# Patient Record
Sex: Male | Born: 1955 | Race: White | Hispanic: No | Smoking: Former smoker
Health system: Southern US, Community
[De-identification: ages and names within clinical notes are randomized; demographics above are authoritative.]

## PROBLEM LIST (undated history)

## (undated) DIAGNOSIS — J449 Chronic obstructive pulmonary disease, unspecified: Secondary | ICD-10-CM

## (undated) DIAGNOSIS — R9430 Abnormal result of cardiovascular function study, unspecified: Secondary | ICD-10-CM

## (undated) DIAGNOSIS — Z860100 Personal history of colon polyps, unspecified: Secondary | ICD-10-CM

## (undated) DIAGNOSIS — I779 Disorder of arteries and arterioles, unspecified: Secondary | ICD-10-CM

## (undated) DIAGNOSIS — T7840XA Allergy, unspecified, initial encounter: Secondary | ICD-10-CM

## (undated) DIAGNOSIS — I2510 Atherosclerotic heart disease of native coronary artery without angina pectoris: Secondary | ICD-10-CM

## (undated) DIAGNOSIS — R001 Bradycardia, unspecified: Secondary | ICD-10-CM

## (undated) DIAGNOSIS — K219 Gastro-esophageal reflux disease without esophagitis: Secondary | ICD-10-CM

## (undated) DIAGNOSIS — K225 Diverticulum of esophagus, acquired: Secondary | ICD-10-CM

## (undated) DIAGNOSIS — J45909 Unspecified asthma, uncomplicated: Secondary | ICD-10-CM

## (undated) DIAGNOSIS — I739 Peripheral vascular disease, unspecified: Secondary | ICD-10-CM

## (undated) DIAGNOSIS — I10 Essential (primary) hypertension: Secondary | ICD-10-CM

## (undated) DIAGNOSIS — K449 Diaphragmatic hernia without obstruction or gangrene: Secondary | ICD-10-CM

## (undated) DIAGNOSIS — E785 Hyperlipidemia, unspecified: Secondary | ICD-10-CM

## (undated) HISTORY — DX: Bradycardia, unspecified: R00.1

## (undated) HISTORY — DX: Gastro-esophageal reflux disease without esophagitis: K21.9

## (undated) HISTORY — DX: Personal history of colon polyps, unspecified: Z86.0100

## (undated) HISTORY — DX: Nonrheumatic mitral (valve) insufficiency: I34.0

## (undated) HISTORY — DX: Diverticulum of esophagus, acquired: K22.5

## (undated) HISTORY — DX: Chronic obstructive pulmonary disease, unspecified: J44.9

## (undated) HISTORY — PX: COLONOSCOPY: SHX174

## (undated) HISTORY — DX: Hyperlipidemia, unspecified: E78.5

## (undated) HISTORY — DX: Abnormal result of cardiovascular function study, unspecified: R94.30

## (undated) HISTORY — DX: Peripheral vascular disease, unspecified: I73.9

## (undated) HISTORY — DX: Personal history of colonic polyps: Z86.010

## (undated) HISTORY — DX: Essential (primary) hypertension: I10

## (undated) HISTORY — DX: Atherosclerotic heart disease of native coronary artery without angina pectoris: I25.10

## (undated) HISTORY — DX: Allergy, unspecified, initial encounter: T78.40XA

## (undated) HISTORY — DX: Diaphragmatic hernia without obstruction or gangrene: K44.9

## (undated) HISTORY — DX: Disorder of arteries and arterioles, unspecified: I77.9

## (undated) HISTORY — DX: Unspecified asthma, uncomplicated: J45.909

---

## 2002-02-24 HISTORY — PX: CORONARY ANGIOPLASTY WITH STENT PLACEMENT: SHX49

## 2002-03-11 ENCOUNTER — Inpatient Hospital Stay (HOSPITAL_COMMUNITY): Admission: EM | Admit: 2002-03-11 | Discharge: 2002-03-17 | Payer: Self-pay | Admitting: Emergency Medicine

## 2002-03-11 ENCOUNTER — Encounter: Payer: Self-pay | Admitting: *Deleted

## 2002-03-11 ENCOUNTER — Encounter: Payer: Self-pay | Admitting: Cardiovascular Disease

## 2002-04-07 ENCOUNTER — Encounter (HOSPITAL_COMMUNITY): Admission: RE | Admit: 2002-04-07 | Discharge: 2002-07-06 | Payer: Self-pay | Admitting: Cardiology

## 2004-09-27 ENCOUNTER — Ambulatory Visit: Payer: Self-pay | Admitting: Cardiology

## 2005-04-11 ENCOUNTER — Ambulatory Visit: Payer: Self-pay | Admitting: Cardiology

## 2005-04-16 ENCOUNTER — Ambulatory Visit: Payer: Self-pay | Admitting: Cardiology

## 2005-04-27 ENCOUNTER — Ambulatory Visit: Payer: Self-pay | Admitting: Internal Medicine

## 2005-05-28 ENCOUNTER — Ambulatory Visit: Payer: Self-pay | Admitting: Internal Medicine

## 2005-05-28 ENCOUNTER — Encounter (INDEPENDENT_AMBULATORY_CARE_PROVIDER_SITE_OTHER): Payer: Self-pay | Admitting: *Deleted

## 2005-05-28 LAB — HM COLONOSCOPY

## 2005-05-30 ENCOUNTER — Ambulatory Visit (HOSPITAL_COMMUNITY): Admission: RE | Admit: 2005-05-30 | Discharge: 2005-05-30 | Payer: Self-pay | Admitting: Internal Medicine

## 2005-08-21 ENCOUNTER — Ambulatory Visit (HOSPITAL_COMMUNITY): Admission: RE | Admit: 2005-08-21 | Discharge: 2005-08-21 | Payer: Self-pay | Admitting: Otolaryngology

## 2005-08-27 HISTORY — PX: ZENKER'S DIVERTICULECTOMY ENDOSCOPIC: SHX5224

## 2005-09-07 ENCOUNTER — Observation Stay (HOSPITAL_COMMUNITY): Admission: EM | Admit: 2005-09-07 | Discharge: 2005-09-07 | Payer: Self-pay | Admitting: Emergency Medicine

## 2005-09-19 ENCOUNTER — Ambulatory Visit (HOSPITAL_COMMUNITY): Admission: RE | Admit: 2005-09-19 | Discharge: 2005-09-20 | Payer: Self-pay | Admitting: Otolaryngology

## 2006-04-16 ENCOUNTER — Ambulatory Visit: Payer: Self-pay | Admitting: Cardiology

## 2006-06-14 ENCOUNTER — Ambulatory Visit: Payer: Self-pay | Admitting: Internal Medicine

## 2006-12-11 ENCOUNTER — Ambulatory Visit: Payer: Self-pay | Admitting: Internal Medicine

## 2006-12-11 ENCOUNTER — Encounter: Payer: Self-pay | Admitting: Internal Medicine

## 2006-12-11 LAB — CONVERTED CEMR LAB
ALT: 22 units/L (ref 0–40)
Albumin: 4 g/dL (ref 3.5–5.2)
BUN: 16 mg/dL (ref 6–23)
CO2: 28 meq/L (ref 19–32)
Calcium: 9.5 mg/dL (ref 8.4–10.5)
Chloride: 107 meq/L (ref 96–112)
Cholesterol: 127 mg/dL (ref 0–200)
Creatinine, Ser: 0.9 mg/dL (ref 0.4–1.5)
GFR calc Af Amer: 114 mL/min
GFR calc non Af Amer: 94 mL/min
Glucose, Bld: 101 mg/dL — ABNORMAL HIGH (ref 70–99)
HDL: 43.9 mg/dL (ref >39.0)
LDL Cholesterol: 60 mg/dL (ref 0–99)
Phosphorus: 3.3 mg/dL (ref 2.3–4.6)
Potassium: 4.4 meq/L (ref 3.5–5.1)
Sodium: 141 meq/L (ref 135–145)
Total CHOL/HDL Ratio: 2.9
Triglycerides: 114 mg/dL (ref 0–149)
VLDL: 23 mg/dL (ref 0–40)

## 2007-04-03 ENCOUNTER — Ambulatory Visit: Payer: Self-pay | Admitting: Cardiology

## 2007-04-18 ENCOUNTER — Ambulatory Visit: Payer: Self-pay

## 2007-05-05 ENCOUNTER — Ambulatory Visit: Payer: Self-pay | Admitting: Cardiology

## 2007-05-16 ENCOUNTER — Encounter: Payer: Self-pay | Admitting: Cardiology

## 2007-05-16 ENCOUNTER — Ambulatory Visit: Payer: Self-pay

## 2007-06-13 ENCOUNTER — Ambulatory Visit: Payer: Self-pay | Admitting: Internal Medicine

## 2007-06-16 LAB — CONVERTED CEMR LAB
ALT: 32 units/L (ref 0–53)
Albumin: 4.2 g/dL (ref 3.5–5.2)
BUN: 15 mg/dL (ref 6–23)
CO2: 30 meq/L (ref 19–32)
Calcium: 9.6 mg/dL (ref 8.4–10.5)
Chloride: 108 meq/L (ref 96–112)
Cholesterol: 141 mg/dL (ref 0–200)
Creatinine, Ser: 0.9 mg/dL (ref 0.4–1.5)
GFR calc Af Amer: 114 mL/min
GFR calc non Af Amer: 94 mL/min
Glucose, Bld: 101 mg/dL — ABNORMAL HIGH (ref 70–99)
HDL: 41.8 mg/dL (ref >39.0)
LDL Cholesterol: 72 mg/dL (ref 0–99)
Phosphorus: 3.4 mg/dL (ref 2.3–4.6)
Potassium: 4.6 meq/L (ref 3.5–5.1)
Sodium: 143 meq/L (ref 135–145)
Total CHOL/HDL Ratio: 3.4
Triglycerides: 136 mg/dL (ref 0–149)
VLDL: 27 mg/dL (ref 0–40)

## 2007-06-24 ENCOUNTER — Ambulatory Visit: Payer: Self-pay | Admitting: Cardiology

## 2007-07-14 ENCOUNTER — Encounter: Payer: Self-pay | Admitting: Cardiology

## 2007-07-14 ENCOUNTER — Ambulatory Visit: Payer: Self-pay

## 2007-07-17 ENCOUNTER — Ambulatory Visit: Payer: Self-pay | Admitting: Cardiology

## 2007-11-10 ENCOUNTER — Ambulatory Visit: Payer: Self-pay | Admitting: Internal Medicine

## 2007-11-28 ENCOUNTER — Ambulatory Visit: Payer: Self-pay | Admitting: Internal Medicine

## 2007-11-28 DIAGNOSIS — J309 Allergic rhinitis, unspecified: Secondary | ICD-10-CM | POA: Insufficient documentation

## 2007-12-01 LAB — CONVERTED CEMR LAB
ALT: 29 units/L (ref 0–53)
AST: 27 units/L (ref 0–37)
Albumin: 4.2 g/dL (ref 3.5–5.2)
Alkaline Phosphatase: 70 units/L (ref 39–117)
BUN: 16 mg/dL (ref 6–23)
Basophils Absolute: 0 10*3/uL (ref 0.0–0.1)
Basophils Relative: 0.5 % (ref 0.0–1.0)
Bilirubin, Direct: 0.2 mg/dL (ref 0.0–0.3)
CO2: 29 meq/L (ref 19–32)
Calcium: 9.9 mg/dL (ref 8.4–10.5)
Chloride: 107 meq/L (ref 96–112)
Cholesterol: 127 mg/dL (ref 0–200)
Creatinine, Ser: 1 mg/dL (ref 0.4–1.5)
Eosinophils Absolute: 0.1 10*3/uL (ref 0.0–0.7)
Eosinophils Relative: 1.1 % (ref 0.0–5.0)
GFR calc Af Amer: 101 mL/min
GFR calc non Af Amer: 83 mL/min
Glucose, Bld: 107 mg/dL — ABNORMAL HIGH (ref 70–99)
HCT: 46.9 % (ref 39.0–52.0)
HDL: 40.6 mg/dL (ref >39.0)
Hemoglobin: 15.2 g/dL (ref 13.0–17.0)
LDL Cholesterol: 69 mg/dL (ref 0–99)
Lymphocytes Relative: 35.7 % (ref 12.0–46.0)
MCHC: 32.4 g/dL (ref 30.0–36.0)
MCV: 91.5 fL (ref 78.0–100.0)
Monocytes Absolute: 0.5 10*3/uL (ref 0.1–1.0)
Monocytes Relative: 8.2 % (ref 3.0–12.0)
Neutro Abs: 3.3 10*3/uL (ref 1.4–7.7)
Neutrophils Relative %: 54.5 % (ref 43.0–77.0)
PSA: 2.3 ng/mL (ref 0.10–4.00)
Phosphorus: 4.5 mg/dL (ref 2.3–4.6)
Platelets: 237 10*3/uL (ref 150–400)
Potassium: 4.7 meq/L (ref 3.5–5.1)
RBC: 5.13 M/uL (ref 4.22–5.81)
RDW: 12.1 % (ref 11.5–14.6)
Sodium: 142 meq/L (ref 135–145)
TSH: 1.74 microintl units/mL (ref 0.35–5.50)
Total Bilirubin: 0.9 mg/dL (ref 0.3–1.2)
Total CHOL/HDL Ratio: 3.1
Total Protein: 7 g/dL (ref 6.0–8.3)
Triglycerides: 89 mg/dL (ref 0–149)
VLDL: 18 mg/dL (ref 0–40)
WBC: 6.1 10*3/uL (ref 4.5–10.5)

## 2007-12-29 ENCOUNTER — Ambulatory Visit: Payer: Self-pay | Admitting: Cardiology

## 2008-06-04 ENCOUNTER — Ambulatory Visit: Payer: Self-pay | Admitting: Internal Medicine

## 2008-06-07 LAB — CONVERTED CEMR LAB
ALT: 42 units/L (ref 0–53)
AST: 37 units/L (ref 0–37)
Albumin: 4.4 g/dL (ref 3.5–5.2)
Alkaline Phosphatase: 75 units/L (ref 39–117)
BUN: 17 mg/dL (ref 6–23)
Bilirubin, Direct: 0.2 mg/dL (ref 0.0–0.3)
CO2: 26 meq/L (ref 19–32)
Calcium: 9.8 mg/dL (ref 8.4–10.5)
Chloride: 105 meq/L (ref 96–112)
Cholesterol: 148 mg/dL (ref 0–200)
Creatinine, Ser: 1 mg/dL (ref 0.4–1.5)
GFR calc Af Amer: 101 mL/min
GFR calc non Af Amer: 83 mL/min
Glucose, Bld: 90 mg/dL (ref 70–99)
HDL: 43.5 mg/dL (ref >39.0)
LDL Cholesterol: 87 mg/dL (ref 0–99)
Phosphorus: 3.8 mg/dL (ref 2.3–4.6)
Potassium: 4.1 meq/L (ref 3.5–5.1)
Sodium: 139 meq/L (ref 135–145)
Total Bilirubin: 1.4 mg/dL — ABNORMAL HIGH (ref 0.3–1.2)
Total CHOL/HDL Ratio: 3.4
Total Protein: 7 g/dL (ref 6.0–8.3)
Triglycerides: 88 mg/dL (ref 0–149)
VLDL: 18 mg/dL (ref 0–40)

## 2008-07-06 ENCOUNTER — Ambulatory Visit: Payer: Self-pay | Admitting: Cardiology

## 2008-12-14 ENCOUNTER — Ambulatory Visit: Payer: Self-pay | Admitting: Internal Medicine

## 2008-12-16 LAB — CONVERTED CEMR LAB
ALT: 42 units/L (ref 0–53)
AST: 38 units/L — ABNORMAL HIGH (ref 0–37)
Albumin: 4.3 g/dL (ref 3.5–5.2)
Alkaline Phosphatase: 72 units/L (ref 39–117)
BUN: 10 mg/dL (ref 6–23)
Basophils Absolute: 0 10*3/uL (ref 0.0–0.1)
Basophils Relative: 0.7 % (ref 0.0–3.0)
Bilirubin, Direct: 0.1 mg/dL (ref 0.0–0.3)
CO2: 30 meq/L (ref 19–32)
Calcium: 9.7 mg/dL (ref 8.4–10.5)
Chloride: 107 meq/L (ref 96–112)
Cholesterol: 166 mg/dL (ref 0–200)
Creatinine, Ser: 0.9 mg/dL (ref 0.4–1.5)
Eosinophils Absolute: 0.1 10*3/uL (ref 0.0–0.7)
Eosinophils Relative: 1.6 % (ref 0.0–5.0)
Glucose, Bld: 104 mg/dL — ABNORMAL HIGH (ref 70–99)
HCT: 44.1 % (ref 39.0–52.0)
HDL: 48.3 mg/dL (ref >39.00)
Hemoglobin: 15 g/dL (ref 13.0–17.0)
LDL Cholesterol: 87 mg/dL (ref 0–99)
Lymphocytes Relative: 34 % (ref 12.0–46.0)
Lymphs Abs: 2.3 10*3/uL (ref 0.7–4.0)
MCHC: 34.1 g/dL (ref 30.0–36.0)
MCV: 90.4 fL (ref 78.0–100.0)
Monocytes Absolute: 0.6 10*3/uL (ref 0.1–1.0)
Monocytes Relative: 8.2 % (ref 3.0–12.0)
Neutro Abs: 3.9 10*3/uL (ref 1.4–7.7)
Neutrophils Relative %: 55.5 % (ref 43.0–77.0)
PSA: 1.79 ng/mL (ref 0.10–4.00)
Phosphorus: 4 mg/dL (ref 2.3–4.6)
Platelets: 207 10*3/uL (ref 150.0–400.0)
Potassium: 4.1 meq/L (ref 3.5–5.1)
RBC: 4.88 M/uL (ref 4.22–5.81)
RDW: 11.9 % (ref 11.5–14.6)
Sodium: 142 meq/L (ref 135–145)
TSH: 1.53 microintl units/mL (ref 0.35–5.50)
Total Bilirubin: 0.9 mg/dL (ref 0.3–1.2)
Total CHOL/HDL Ratio: 3
Total Protein: 7 g/dL (ref 6.0–8.3)
Triglycerides: 154 mg/dL — ABNORMAL HIGH (ref 0.0–149.0)
VLDL: 30.8 mg/dL (ref 0.0–40.0)
WBC: 6.9 10*3/uL (ref 4.5–10.5)

## 2009-01-03 ENCOUNTER — Ambulatory Visit: Payer: Self-pay | Admitting: Cardiology

## 2009-04-27 HISTORY — PX: OTHER SURGICAL HISTORY: SHX169

## 2009-05-24 ENCOUNTER — Inpatient Hospital Stay (HOSPITAL_COMMUNITY): Admission: EM | Admit: 2009-05-24 | Discharge: 2009-05-28 | Payer: Self-pay | Admitting: Emergency Medicine

## 2009-06-14 ENCOUNTER — Ambulatory Visit: Payer: Self-pay | Admitting: Internal Medicine

## 2009-06-15 LAB — CONVERTED CEMR LAB
ALT: 24 units/L (ref 0–53)
AST: 23 units/L (ref 0–37)
Albumin: 4 g/dL (ref 3.5–5.2)
Alkaline Phosphatase: 108 units/L (ref 39–117)
BUN: 14 mg/dL (ref 6–23)
Basophils Absolute: 0 10*3/uL (ref 0.0–0.1)
Basophils Relative: 0.9 % (ref 0.0–3.0)
Bilirubin, Direct: 0 mg/dL (ref 0.0–0.3)
CO2: 27 meq/L (ref 19–32)
Calcium: 9.6 mg/dL (ref 8.4–10.5)
Chloride: 105 meq/L (ref 96–112)
Cholesterol: 132 mg/dL (ref 0–200)
Creatinine, Ser: 0.9 mg/dL (ref 0.4–1.5)
Eosinophils Absolute: 0.1 10*3/uL (ref 0.0–0.7)
Eosinophils Relative: 1.5 % (ref 0.0–5.0)
GFR calc non Af Amer: 93.14 mL/min (ref >60)
Glucose, Bld: 100 mg/dL — ABNORMAL HIGH (ref 70–99)
HCT: 40 % (ref 39.0–52.0)
HDL: 38.7 mg/dL — ABNORMAL LOW (ref >39.00)
Hemoglobin: 13.4 g/dL (ref 13.0–17.0)
LDL Cholesterol: 69 mg/dL (ref 0–99)
Lymphocytes Relative: 34.6 % (ref 12.0–46.0)
Lymphs Abs: 1.8 10*3/uL (ref 0.7–4.0)
MCHC: 33.6 g/dL (ref 30.0–36.0)
MCV: 89.8 fL (ref 78.0–100.0)
Monocytes Absolute: 0.4 10*3/uL (ref 0.1–1.0)
Monocytes Relative: 7.7 % (ref 3.0–12.0)
Neutro Abs: 2.8 10*3/uL (ref 1.4–7.7)
Neutrophils Relative %: 55.3 % (ref 43.0–77.0)
Phosphorus: 4.1 mg/dL (ref 2.3–4.6)
Platelets: 346 10*3/uL (ref 150.0–400.0)
Potassium: 4.3 meq/L (ref 3.5–5.1)
RBC: 4.45 M/uL (ref 4.22–5.81)
RDW: 12.8 % (ref 11.5–14.6)
Sodium: 140 meq/L (ref 135–145)
Total Bilirubin: 0.8 mg/dL (ref 0.3–1.2)
Total CHOL/HDL Ratio: 3
Total Protein: 6.8 g/dL (ref 6.0–8.3)
Triglycerides: 123 mg/dL (ref 0.0–149.0)
VLDL: 24.6 mg/dL (ref 0.0–40.0)
WBC: 5.1 10*3/uL (ref 4.5–10.5)

## 2009-06-26 ENCOUNTER — Encounter: Payer: Self-pay | Admitting: Cardiology

## 2009-06-27 ENCOUNTER — Ambulatory Visit: Payer: Self-pay | Admitting: Cardiology

## 2009-08-18 ENCOUNTER — Telehealth: Payer: Self-pay | Admitting: Cardiology

## 2009-12-20 ENCOUNTER — Ambulatory Visit: Payer: Self-pay | Admitting: Internal Medicine

## 2009-12-21 LAB — CONVERTED CEMR LAB
ALT: 34 units/L (ref 0–53)
AST: 29 units/L (ref 0–37)
Albumin: 4.7 g/dL (ref 3.5–5.2)
Alkaline Phosphatase: 76 units/L (ref 39–117)
BUN: 22 mg/dL (ref 6–23)
Basophils Absolute: 0 10*3/uL (ref 0.0–0.1)
Basophils Relative: 0.5 % (ref 0.0–3.0)
Bilirubin, Direct: 0.2 mg/dL (ref 0.0–0.3)
CO2: 27 meq/L (ref 19–32)
Calcium: 10 mg/dL (ref 8.4–10.5)
Chloride: 104 meq/L (ref 96–112)
Cholesterol: 146 mg/dL (ref 0–200)
Creatinine, Ser: 0.9 mg/dL (ref 0.4–1.5)
Eosinophils Absolute: 0.1 10*3/uL (ref 0.0–0.7)
Eosinophils Relative: 1.3 % (ref 0.0–5.0)
GFR calc non Af Amer: 92.96 mL/min (ref >60)
Glucose, Bld: 104 mg/dL — ABNORMAL HIGH (ref 70–99)
HCT: 42.7 % (ref 39.0–52.0)
HDL: 46.5 mg/dL (ref >39.00)
Hemoglobin: 14.8 g/dL (ref 13.0–17.0)
LDL Cholesterol: 81 mg/dL (ref 0–99)
Lymphocytes Relative: 39.5 % (ref 12.0–46.0)
Lymphs Abs: 2.7 10*3/uL (ref 0.7–4.0)
MCHC: 34.7 g/dL (ref 30.0–36.0)
MCV: 89.8 fL (ref 78.0–100.0)
Monocytes Absolute: 0.5 10*3/uL (ref 0.1–1.0)
Monocytes Relative: 8.1 % (ref 3.0–12.0)
Neutro Abs: 3.4 10*3/uL (ref 1.4–7.7)
Neutrophils Relative %: 50.6 % (ref 43.0–77.0)
PSA: 2.47 ng/mL (ref 0.10–4.00)
Phosphorus: 4.1 mg/dL (ref 2.3–4.6)
Platelets: 234 10*3/uL (ref 150.0–400.0)
Potassium: 4.3 meq/L (ref 3.5–5.1)
RBC: 4.76 M/uL (ref 4.22–5.81)
RDW: 13.1 % (ref 11.5–14.6)
Sodium: 140 meq/L (ref 135–145)
TSH: 1.45 microintl units/mL (ref 0.35–5.50)
Total Bilirubin: 1 mg/dL (ref 0.3–1.2)
Total CHOL/HDL Ratio: 3
Total Protein: 6.8 g/dL (ref 6.0–8.3)
Triglycerides: 91 mg/dL (ref 0.0–149.0)
VLDL: 18.2 mg/dL (ref 0.0–40.0)
WBC: 6.8 10*3/uL (ref 4.5–10.5)

## 2009-12-30 ENCOUNTER — Ambulatory Visit: Payer: Self-pay | Admitting: Cardiology

## 2010-01-29 ENCOUNTER — Ambulatory Visit (HOSPITAL_COMMUNITY): Admission: EM | Admit: 2010-01-29 | Discharge: 2010-01-29 | Payer: Self-pay | Admitting: Neurosurgery

## 2010-01-29 ENCOUNTER — Emergency Department: Payer: Self-pay | Admitting: Internal Medicine

## 2010-02-03 ENCOUNTER — Ambulatory Visit: Payer: Self-pay | Admitting: Internal Medicine

## 2010-02-03 DIAGNOSIS — S0003XA Contusion of scalp, initial encounter: Secondary | ICD-10-CM | POA: Insufficient documentation

## 2010-02-03 DIAGNOSIS — S1093XA Contusion of unspecified part of neck, initial encounter: Secondary | ICD-10-CM | POA: Insufficient documentation

## 2010-02-03 DIAGNOSIS — S0083XA Contusion of other part of head, initial encounter: Secondary | ICD-10-CM

## 2010-02-15 ENCOUNTER — Encounter: Payer: Self-pay | Admitting: Internal Medicine

## 2010-06-02 ENCOUNTER — Ambulatory Visit: Payer: Self-pay | Admitting: Internal Medicine

## 2010-06-05 LAB — CONVERTED CEMR LAB
ALT: 28 units/L (ref 0–53)
AST: 25 units/L (ref 0–37)
Albumin: 4.4 g/dL (ref 3.5–5.2)
Alkaline Phosphatase: 83 units/L (ref 39–117)
BUN: 14 mg/dL (ref 6–23)
Bilirubin, Direct: 0.1 mg/dL (ref 0.0–0.3)
CO2: 28 meq/L (ref 19–32)
Calcium: 10.1 mg/dL (ref 8.4–10.5)
Chloride: 105 meq/L (ref 96–112)
Cholesterol: 151 mg/dL (ref 0–200)
Creatinine, Ser: 0.8 mg/dL (ref 0.4–1.5)
GFR calc non Af Amer: 101.9 mL/min (ref >60)
Glucose, Bld: 97 mg/dL (ref 70–99)
HDL: 41 mg/dL (ref >39.00)
LDL Cholesterol: 72 mg/dL (ref 0–99)
Phosphorus: 3.9 mg/dL (ref 2.3–4.6)
Potassium: 4.5 meq/L (ref 3.5–5.1)
Sodium: 140 meq/L (ref 135–145)
Total Bilirubin: 0.9 mg/dL (ref 0.3–1.2)
Total CHOL/HDL Ratio: 4
Total Protein: 6.9 g/dL (ref 6.0–8.3)
Triglycerides: 189 mg/dL — ABNORMAL HIGH (ref 0.0–149.0)
VLDL: 37.8 mg/dL (ref 0.0–40.0)

## 2010-06-12 ENCOUNTER — Encounter (INDEPENDENT_AMBULATORY_CARE_PROVIDER_SITE_OTHER): Payer: Self-pay | Admitting: *Deleted

## 2010-06-14 ENCOUNTER — Encounter (INDEPENDENT_AMBULATORY_CARE_PROVIDER_SITE_OTHER): Payer: Self-pay | Admitting: *Deleted

## 2010-06-29 ENCOUNTER — Ambulatory Visit: Payer: Self-pay | Admitting: Cardiology

## 2010-07-03 ENCOUNTER — Encounter: Payer: Self-pay | Admitting: Cardiology

## 2010-07-03 ENCOUNTER — Ambulatory Visit: Payer: Self-pay

## 2010-08-11 ENCOUNTER — Encounter (INDEPENDENT_AMBULATORY_CARE_PROVIDER_SITE_OTHER): Payer: Self-pay | Admitting: *Deleted

## 2010-08-11 ENCOUNTER — Ambulatory Visit: Payer: Self-pay | Admitting: Internal Medicine

## 2010-08-11 DIAGNOSIS — Z860100 Personal history of colon polyps, unspecified: Secondary | ICD-10-CM | POA: Insufficient documentation

## 2010-08-11 DIAGNOSIS — K649 Unspecified hemorrhoids: Secondary | ICD-10-CM | POA: Insufficient documentation

## 2010-09-12 ENCOUNTER — Encounter: Payer: Self-pay | Admitting: Internal Medicine

## 2010-09-18 ENCOUNTER — Encounter
Admission: RE | Admit: 2010-09-18 | Discharge: 2010-09-18 | Payer: Self-pay | Source: Home / Self Care | Attending: Otolaryngology | Admitting: Otolaryngology
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
## 2010-09-26 NOTE — Assessment & Plan Note (Signed)
Summary: ROA FOR 6 MONTH FOLLOW-UP/JRR   Vital Signs:  Patient profile:   55 year old male Weight:      186 pounds Temp:     98.2 degrees F oral Pulse rate:   60 / minute Pulse rhythm:   regular BP sitting:   140 / 70  (left arm) Cuff size:   large  Vitals Entered By: Mervin Hack CMA Duncan Dull) (June 02, 2010 8:22 AM) CC: 6 month follow-up   History of Present Illness: Doing okay  Fitness level is stable stays active though not on bike in a few weeks No chest pain No SOB No edema  No myalgias or other side effects with statin  some post nasal drip  mild cough  Has to clear throat some Doesn't feel sick Does take the claritin  Allergies: No Known Drug Allergies  Past History:  Past medical, surgical, family and social histories (including risk factors) reviewed for relevance to current acute and chronic problems.  Past Medical History: Reviewed history from 12/20/2009 and no changes required. Colonic polyps, hx of CAD.---Dr Myrtis Ser.Marland KitchenMarland KitchenMarland KitchenCypher stent right coronary artery and circumflex..2003 . (long term aspirin & plavix) /   nuclear           stress... 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 related to pulse measurement when patient having PVCs.... benign PVCs.Marland Kitchen stable... these            affect the ability to measure his pulse and his blood pressure.  Past Surgical History: Reviewed history from 06/14/2009 and no changes required. 7/03   MI    2 stents 1/07  Zenker's diverticulum repair 9/10  Right shoulder, left wrist repair----------------Drs Gramig/Supple  Family History: Reviewed history from 12/14/2008 and no changes required. Father: Died at age 15, MVA, had CABG (53) Mother: Alive with HTN,CHF (?CAD) Siblings: One brother, 2 sisters CAD in  Dad,  both sides HTN in Mom & others ?? DM on  Maternal side No prostate or colon cancer  Social History: Reviewed history from 06/13/2007 and no changes required. 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   Neck:  supple, no masses, no thyromegaly, no carotid bruits, and no cervical lymphadenopathy.   Lungs:  normal respiratory effort, no intercostal retractions, no accessory muscle use, and normal breath sounds.   Heart:  normal rate, regular rhythm, no murmur, and no gallop.   Abdomen:  soft and non-tender.   Extremities:  no edema Psych:  normally interactive, good eye contact, not anxious appearing, and not depressed appearing.     Impression & Recommendations:  Problem # 1:  HYPERTENSION (ICD-401.9) Assessment Unchanged  reasonable control no changes  His updated medication list for this problem includes:    Lisinopril 20 Mg Tabs (Lisinopril) .Marland Kitchen... Take 1 by mouth once daily  BP today: 140/70 Prior BP: 150/80 (02/03/2010)  Labs Reviewed: K+: 4.3 (12/20/2009) Creat: : 0.9 (12/20/2009)   Chol: 146 (12/20/2009)   HDL: 46.50 (12/20/2009)   LDL: 81 (12/20/2009)   TG: 91.0 (12/20/2009)  Orders: TLB-Renal Function Panel (80069-RENAL)  Problem # 2:  HYPERLIPIDEMIA (ICD-272.4) Assessment: Unchanged  pretty much at  goal will recheck  His updated medication list for this problem includes:    Lipitor 40 Mg Tabs (Atorvastatin calcium) .Marland Kitchen... Take one by mouth once a day  Labs Reviewed: SGOT: 29 (12/20/2009)   SGPT: 34 (12/20/2009)   HDL:46.50 (12/20/2009), 38.70 (06/14/2009)  LDL:81 (12/20/2009), 69 (06/14/2009)  Chol:146 (12/20/2009), 132 (06/14/2009)  Trig:91.0 (12/20/2009), 123.0 (06/14/2009)  Orders: TLB-Lipid Panel  (80061-LIPID) TLB-Hepatic/Liver Function Pnl (80076-HEPATIC) Venipuncture (11914)  Problem # 3:  CORONARY ARTERY DISEASE (ICD-414.00) Assessment: Unchanged quiet stable exercise status  His updated medication list for this problem includes:    Plavix 75 Mg Tabs (Clopidogrel bisulfate) .Marland Kitchen... Take one by mouth once a day    Aspirin 81 Mg Tbec (Aspirin) .Marland Kitchen... Take one by mouth once daily    Lisinopril 20 Mg Tabs (Lisinopril) .Marland Kitchen... Take 1 by mouth once daily    Nitroglycerin 0.4 Mg Subl (Nitroglycerin) ..... One tablet under tongue every 5 minutes as needed for chest pain---may repeat times three  Problem # 4:  ALLERGIC RHINITIS (ICD-477.9) Assessment: Deteriorated discussed increased Rx  His updated medication list for this problem includes:    Loratadine 10 Mg Tabs (Loratadine) .Marland Kitchen... 1-2  as needed by mouth daily for allergies  Complete Medication List: 1)  Lipitor 40 Mg Tabs (Atorvastatin calcium) .... Take one by mouth once a day 2)  Plavix 75 Mg Tabs (Clopidogrel bisulfate) .... Take one by mouth once a day 3)  Aspirin 81 Mg Tbec (Aspirin) .... Take one by mouth once daily 4)  Lisinopril 20 Mg Tabs (Lisinopril) .... Take 1 by mouth once daily 5)  Loratadine 10 Mg Tabs (Loratadine) .Marland Kitchen.. 1-2  as needed by mouth daily for allergies 6)  Nitroglycerin 0.4 Mg Subl (Nitroglycerin) .... One tablet under tongue every 5 minutes as needed for chest pain---may repeat times three 7)  Fish Oil 1360 Mg Caps (Omega-3 fatty acids) .... Take 1 by mouth once daily 8)  Multivitamins Tabs (Multiple vitamin) .... Take one by mouth once a day 9)  Co Q-10 120 Mg Caps (Coenzyme q10) .... Take 1 tablet by mouth once a day 10)  Vitamin D 1000 Unit Tabs (Cholecalciferol) .... Take 1 tablet by mouth once a day  Patient Instructions: 1)  Please try cetirizine 10mg  daily for allergies if the loratadine is ineffective 2)  Please schedule a follow-up appointment in 6 months for physical  Current Allergies  (reviewed today): No known allergies

## 2010-09-26 NOTE — Letter (Signed)
Summary: New Patient letter  [facility]  [address] [place], [place] [address]   Phone: [phone]  Fax: [phone]       06/14/2010 MRN: [id number]  [name] [address] [place], [place]  [address]  Dear [name],  Welcome to the Gastroenterology Division at [facility].    You are scheduled to see Dr.  Leone Payor on 08-11-10 at 3:00p.m. on the 3rd floor at [facility], [address]  We ask that you try to arrive at our office 15 minutes prior to your appointment time to allow for check-in.  We would like you to complete the enclosed self-administered evaluation form prior to your visit and bring it with you on the day of your appointment.  We will review it with you.  Also, please bring a complete list of all your medications or, if you prefer, bring the medication bottles and we will list them.  Please bring your insurance card so that we may make a copy of it.  If your insurance requires a referral to see a specialist, please bring your referral form from your primary care physician.  Co-payments are due at the time of your visit and may be paid by cash, check or credit card.     Your office visit will consist of a consult with your physician (includes a physical exam), any laboratory testing he/she may order, scheduling of any necessary diagnostic testing (e.g. x-ray, ultrasound, CT-scan), and scheduling of a procedure (e.g. Endoscopy, Colonoscopy) if required.  Please allow enough time on your schedule to allow for any/all of these possibilities.    If you cannot keep your appointment, please call [phone] to cancel or reschedule prior to your appointment date.  This allows [place] the opportunity to schedule an appointment for another patient in need of care.  If you do not cancel or reschedule by 5 p.m. the business day prior to your appointment date, you will be charged a $50.00 late cancellation/no-show fee.    Thank you for choosing [facility] for your medical needs.  We appreciate the opportunity to care for you.  Please visit [place] at our website  to learn more about our practice.                     Sincerely,                                                             The Gastroenterology Division
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
## 2010-09-26 NOTE — Letter (Signed)
Summary: Colonoscopy-Changed to Office Visit Letter  [facility]  [address] [place], [place] [address]   Phone: [phone]  Fax: [phone]      June 12, 2010 MRN: [id number]   [name] [address] [place], [place]  [address]   Dear [name],   According to our records, it is time for you to schedule a Colonoscopy. However, after reviewing your medical record, I feel that an office visit would be most appropriate to more completely evaluate you and determine your need for a repeat procedure.  Please call [phone] (option #2) at your convenience to schedule an office visit. If you have any questions, concerns, or feel that this letter is in error, we would appreciate your call.   Sincerely,   Iva Boop, M.D.  [facility] Gastroenterology Division [phone]

## 2010-09-26 NOTE — Assessment & Plan Note (Signed)
Summary: F6M/DFG      Allergies Added: NKDA  Visit Type:  Follow-up Primary Provider:  Cindee Salt MD  CC:  CAD.  History of Present Illness: The patient is seen for followup of coronary artery disease.  He is doing very well.  He does not have any significant chest pain.  He started PVCs when he is bradycardic in the past.  He has not had any difficulties with this lately.  He is taking all of his medications.  His last echo was done in 2008.  Current Medications (verified): 1)  Lipitor 40 Mg  Tabs (Atorvastatin Calcium) .... Take One By Mouth Once A Day 2)  Plavix 75 Mg  Tabs (Clopidogrel Bisulfate) .... Take One By Mouth Once A Day 3)  Aspirin 81 Mg  Tbec (Aspirin) .... Take One By Mouth Once Daily 4)  Lisinopril 20 Mg Tabs (Lisinopril) .... Take 1 By Mouth Once Daily 5)  Loratadine 10 Mg Tabs (Loratadine) .Marland Kitchen.. 1-2  As Needed By Mouth Daily For Allergies 6)  Nitroglycerin 0.4 Mg Subl (Nitroglycerin) .... One Tablet Under Tongue Every 5 Minutes As Needed For Chest Pain---May Repeat Times Three 7)  Fish Oil 1360 Mg Caps (Omega-3 Fatty Acids) .... Take 1 By Mouth Once Daily 8)  Multivitamins   Tabs (Multiple Vitamin) .... Take One By Mouth Once A Day 9)  Co Q-10 120 Mg  Caps (Coenzyme Q10) .... Take 1 Tablet By Mouth Once A Day 10)  Vitamin D 1000 Unit  Tabs (Cholecalciferol) .... Take 1 Tablet By Mouth Once A Day  Allergies (verified): No Known Drug Allergies  Past History:  Past Medical History: Colonic polyps, hx of CAD.---Dr Myrtis Ser.Marland KitchenMarland KitchenMarland KitchenCypher stent right coronary artery and circumflex..2003 . (long term aspirin & plavix) /   nuclear           stress... 04/2007..no ischemia...triplets and couplets in recovery EF  55-60%...echo..06/2007.. 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 related to pulse measurement when patient having PVCs.... benign PVCs.Marland Kitchen  stable... these            affect the ability to measure his pulse and his blood pressure.  Review of Systems       Patient denies fever, chills, headache, sweats, respiratory vision, change in hearing, chest pain, cough, nausea vomiting, urinary symptoms.  All other systems are reviewed and are negative.  Vital Signs:  Patient profile:   55 year old male Height:      71 inches Weight:      186 pounds BMI:     26.04 Pulse rate:   64 / minute BP sitting:   112 / 72  (left arm) Cuff size:   regular  Vitals Entered By: Hardin Negus, RMA (June 29, 2010 3:26 PM)  Physical Exam  General:  patient is quite stable. Head:  head is atraumatic. Eyes:  no xanthelasma. Neck:  no jugular venous distention.  Soft right carotid bruit. Chest Wall:  no chest wall tenderness. Lungs:  lungs are clear.  Respiratory effort is nonlabored. Heart:  cardiac exam reveals S1-S2.  No clicks or significant murmurs. Abdomen:  abdomen is soft. Msk:  no musculoskeletal deformities. Extremities:  no peripheral edema. Skin:  skin rashes. Psych:  patient is oriented to person time and place.  Affect is normal.   Impression & Recommendations:  Problem # 1:  MITRAL REGURGITATION (ICD-396.3) Historically the patient had mild mitral regurgitation.  He does not need  a followup echo now.  Problem # 2:  BRADYCARDIA (ICD-427.89)  His updated medication list for this problem includes:    Plavix 75 Mg Tabs (Clopidogrel bisulfate) .Marland Kitchen... Take one by mouth once a day    Aspirin 81 Mg Tbec (Aspirin) .Marland Kitchen... Take one by mouth once daily    Lisinopril 20 Mg Tabs (Lisinopril) .Marland Kitchen... Take 1 by mouth once daily    Nitroglycerin 0.4 Mg Subl (Nitroglycerin) ..... One tablet under tongue every 5 minutes as needed for chest pain---may repeat times three The patient has had bradycardia.  EKG is done today and reviewed by me.  There is sinus rhythm.  V1 and V2 leads are reversed.  There is no significant change.  One PVC is  seen.  No further workup.  Problem # 3:  PREMATURE VENTRICULAR CONTRACTIONS (ICD-427.69)  His updated medication list for this problem includes:    Plavix 75 Mg Tabs (Clopidogrel bisulfate) .Marland Kitchen... Take one by mouth once a day    Aspirin 81 Mg Tbec (Aspirin) .Marland Kitchen... Take one by mouth once daily    Lisinopril 20 Mg Tabs (Lisinopril) .Marland Kitchen... Take 1 by mouth once daily    Nitroglycerin 0.4 Mg Subl (Nitroglycerin) ..... One tablet under tongue every 5 minutes as needed for chest pain---may repeat times three  Orders: EKG w/ Interpretation (93000) There is a PVC seen today.  We know that PVCs had affected the ability to measure his heart rate in the past.  This is not causing any clinical difficulty.  No change in therapy.  Problem # 4:  HYPERTENSION (ICD-401.9)  His updated medication list for this problem includes:    Aspirin 81 Mg Tbec (Aspirin) .Marland Kitchen... Take one by mouth once daily    Lisinopril 20 Mg Tabs (Lisinopril) .Marland Kitchen... Take 1 by mouth once daily Blood pressure is nicely controlled.  No change in therapy.  Problem # 5:  HYPERLIPIDEMIA (ICD-272.4)  His updated medication list for this problem includes:    Lipitor 40 Mg Tabs (Atorvastatin calcium) .Marland Kitchen... Take one by mouth once a day Lipids are treated.  No change in therapy.  Problem # 6:  CORONARY ARTERY DISEASE (ICD-414.00)  His updated medication list for this problem includes:    Plavix 75 Mg Tabs (Clopidogrel bisulfate) .Marland Kitchen... Take one by mouth once a day    Aspirin 81 Mg Tbec (Aspirin) .Marland Kitchen... Take one by mouth once daily    Lisinopril 20 Mg Tabs (Lisinopril) .Marland Kitchen... Take 1 by mouth once daily    Nitroglycerin 0.4 Mg Subl (Nitroglycerin) ..... One tablet under tongue every 5 minutes as needed for chest pain---may repeat times three Coronary disease is stable.  EKG is done today and reviewed by me.  There is no significant change.  No further workup needed.  Problem # 7:  CAROTID BRUIT (ICD-785.9)  The patient has a very soft carotid  bruit.  He has known vascular disease.  Will arrange for a carotid Doppler.  I will be in touch with him. C. neck in 6 months for followup.  Orders: Carotid Duplex (Carotid Duplex)  Patient Instructions: 1)  Your physician has requested that you have a carotid duplex. This test is an ultrasound of the carotid arteries in your neck. It looks at blood flow through these arteries that supply the brain with blood. Allow one hour for this exam. There are no restrictions or special instructions. 2)  Your physician wants you to follow-up in:  6 months.  You will receive a reminder letter in  the mail two months in advance. If you don't receive a letter, please call our office to schedule the follow-up appointment.

## 2010-09-26 NOTE — Procedures (Signed)
Summary: Gastroenterology  Gastroenterology   Imported By: Omer Jack 12/30/2009 14:16:13  _____________________________________________________________________  External Attachment:    Type:   Image     Comment:   External Document

## 2010-09-26 NOTE — Letter (Signed)
Summary: Health Screening/[facility]  Health Screening/[facility]   Imported By: Lanelle Bal 02/24/2010 12:42:15  _____________________________________________________________________  External Attachment:    Type:   Image     Comment:   External Document

## 2010-09-28 NOTE — Procedures (Signed)
Summary: Colonoscopy   Colonoscopy  Procedure date:  05/28/2005  Findings:      Results: Hemorrhoids.     Location:  [facility].   Patient Name: [name], [name] MRN:  Procedure Procedures: Colonoscopy CPT: 04540.  Personnel: Endoscopist: Iva Boop, MD, [facility].  Referred By: Luis Abed, MD.  Exam Location: Exam performed in Outpatient Clinic. Outpatient  Patient Consent: Procedure, Alternatives, Risks and Benefits discussed, consent obtained, from patient. Consent was obtained by the RN.  Indications  Average Risk Screening Routine.  History  Current Medications: Patient is not currently taking Coumadin.  Allergies: No known allergies.  Pre-Exam Physical: Performed May 28, 2005. Cardio-pulmonary exam, Rectal exam, HEENT exam , Abdominal exam, Mental status exam WNL.  Exam Exam: Extent of exam reached: Cecum, extent intended: Cecum.  The cecum was identified by appendiceal orifice and IC valve. Colon retroflexion performed. Images taken. ASA Classification: III. Tolerance: good.  Monitoring: Pulse and BP monitoring, Oximetry used. Supplemental O2 given.  Colon Prep Used MiraLax for colon prep. Prep results: good.  Sedation Meds: Patient assessed and found to be appropriate for moderate (conscious) sedation. Residual sedation present from prior procedure today.  Fentanyl 25 mcg. given IV. Versed 4 mg. given IV.  Findings - NORMAL EXAM: Cecum to Descending Colon.  OTHER FINDING: ? 2 tiny polyps found in Sigmoid Colon. Biopsy/Other Finding taken. Comments: removed.  HEMORRHOIDS: External. Size: Grade I. ICD9: Hemorrhoids, External: 455.3.   Assessment  Diagnoses: 455.3: Hemorrhoids, External.   Comments: 2 possible polyps Events  Unplanned Interventions: No intervention was required.  Plans  Post Exam Instructions: Restart medications: resume aspirin and Plavix today.  Patient Education: Patient given standard instructions for:  Polyps. Hemorrhoids.  Disposition: After procedure patient sent to recovery. After recovery patient sent home.  Scheduling/Referral: Path Letter, to The Patient, re: next colonoscopy and path results,    cc:   Luis Abed, MD   Loma Sender, MD   The Patient   This report was created from the original endoscopy report, which was reviewed and signed by the above listed endoscopist.   Appended Document: Colonoscopy   Colonoscopy  Procedure date:  05/28/2005  Findings:      Adenomas (2) - diminutive External hemorrhoids

## 2010-09-28 NOTE — Procedures (Signed)
Summary: EGD Report  Abnormal examination, see findings above.  Comments: Zenker's Diverticulum is the cause of dysphagia. No obvious signs of reflux except small hatal hernia.    Patient Name: [name], [name] MRN:  Procedure Procedures: Panendoscopy (EGD) CPT: 43235.  Personnel: Endoscopist: Iva Boop, MD, [facility].  Referred By: Luis Abed, MD.  Exam Location: Exam performed in Outpatient Clinic. Outpatient  Patient Consent: Procedure, Alternatives, Risks and Benefits discussed, consent obtained, from patient. Consent was obtained by the RN.  Indications Symptoms: Dysphagia.  History  Current Medications: Patient is not currently taking Coumadin.  Allergies: No known allergies.  Pre-Exam Physical: Performed May 28, 2005  Cardio-pulmonary exam, HEENT exam, Abdominal exam, Mental status exam WNL.  Exam Exam Info: Maximum depth of insertion Duodenum, intended Duodenum. Patient position: on left side. Gastric retroflexion performed. Images taken. ASA Classification: III. Tolerance: excellent.  Sedation Meds: Patient assessed and found to be appropriate for moderate (conscious) sedation. Cetacaine Spray 2 sprays given aerosolized. Versed 4 mg. given IV. Fentanyl 50 mcg. given IV.  Monitoring: BP and pulse monitoring done. Oximetry used. Supplemental O2 given  Findings OTHER FINDING: Zenker's Diverticulum in Pharynx.  - Normal: Proximal Esophagus to Distal Esophagus. Comments: z-line at 45 cm.  HIATAL HERNIA: Comments: small.  - Normal: Fundus to Duodenal 2nd Portion.   Assessment Abnormal examination, see findings above.  Comments: Zenker's Diverticulum is the cause of dysphagia. No obvious signs of reflux except small hatal hernia. Events  Unplanned Intervention: No unplanned interventions were required.  Plans Comments: He may not need Aciphex. Disposition: After procedure patient sent to recovery. After recovery patient sent home.   Scheduling: Colonoscopy, to Iva Boop, MD, [facility], next   Comments: Schedule barium swallow to evaluate Zenker's diverticulum. I will call results and discuss treatment options.   cc:    Luis Abed, MD   Loma Sender, MD   This report was created from the original endoscopy report, which was reviewed and signed by the above listed endoscopist.   Appended Document: EGD Report   EGD  Procedure date:  05/28/2005  Findings:      Zenker's diverticulum Hiatal hernia

## 2010-09-28 NOTE — Assessment & Plan Note (Signed)
Summary: discuss colon on plavix--ch.    History of Present Illness Visit Type: new  patient  Primary GI MD: Stan Head MD [facility] Primary Provider: Cindee Salt MD Requesting Provider: na Chief Complaint: Colorectal Cancer Screening  History of Present Illness:   55 yo wm with prior diminutive adenomas (2) removed from colon in 2006. he also had a Zenker's diverticulum repaired in 2007. He has been having some occasionbal solid food dysphagia similar to problems prior to correction of Zenker's diverticulum.   GI Review of Systems    Reports dysphagia with solids.      Denies abdominal pain, acid reflux, belching, bloating, chest pain, dysphagia with liquids, heartburn, loss of appetite, nausea, vomiting, vomiting blood, weight loss, and  weight gain.        Denies anal fissure, black tarry stools, change in bowel habit, constipation, diarrhea, diverticulosis, fecal incontinence, heme positive stool, hemorrhoids, irritable bowel syndrome, jaundice, light color stool, liver problems, rectal bleeding, and  rectal pain.     Current Medications (verified): 1)  Lipitor 40 Mg  Tabs (Atorvastatin Calcium) .... Take One By Mouth Once A Day 2)  Plavix 75 Mg  Tabs (Clopidogrel Bisulfate) .... Take One By Mouth Once A Day 3)  Aspirin 81 Mg  Tbec (Aspirin) .... Take One By Mouth Once Daily 4)  Lisinopril 20 Mg Tabs (Lisinopril) .... Take 1 By Mouth Once Daily 5)  Loratadine 10 Mg Tabs (Loratadine) .Marland Kitchen.. 1-2  As Needed By Mouth Daily For Allergies 6)  Nitroglycerin 0.4 Mg Subl (Nitroglycerin) .... One Tablet Under Tongue Every 5 Minutes As Needed For Chest Pain---May Repeat Times Three 7)  Fish Oil 1360 Mg Caps (Omega-3 Fatty Acids) .... Take 1 By Mouth Once Daily 8)  Multivitamins   Tabs (Multiple Vitamin) .... Take One By Mouth Once A Day 9)  Co Q-10 120 Mg  Caps (Coenzyme Q10) .... Take 1 Tablet By Mouth Once A Day 10)  Vitamin D 1000 Unit  Tabs (Cholecalciferol) .... Take 1 Tablet By  Mouth Once A Day  Allergies (verified): No Known Drug Allergies  Past History:  Past Medical History: Colonic polyps, hx of CAD.---Dr Myrtis Ser.Marland KitchenMarland KitchenMarland KitchenCypher stent right coronary artery and circumflex..2003 . (long term aspirin & plavix) /   nuclear           stress... 04/2007..no ischemia...triplets and couplets in recovery EF  55-60%...echo..06/2007.. 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 related to pulse measurement when patient having PVCs.... benign PVCs.Marland Kitchen stable... these            affect the ability to measure his pulse and his blood pressure. Hiatal Hernia Hemorrhoids  Past Surgical History: Reviewed history from 06/14/2009 and no changes required. 7/03   MI    2 stents 1/07  Zenker's diverticulum repair 9/10  Right shoulder, left wrist repair----------------Drs Gramig/Supple  Family History: Reviewed history from 12/14/2008 and no changes required. Father: Died at age 65, MVA, had CABG (20) Mother: Alive with HTN,CHF (?CAD) Siblings: One brother, 2 sisters CAD in  Dad, both sides HTN in Mom & others ?? 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         All other ROS negative except as per HPI.   Vital Signs:  Patient profile:   55 year old male Height:      71 inches Weight:      190 pounds BMI:     26.60 BSA:     2.07 Pulse rate:   64 / minute Pulse rhythm:   regular BP sitting:   128 / 72  (left arm) Cuff size:   regular  Vitals Entered By: Ok Anis CMA (August 11, 2010 2:06 PM)  Physical  Exam  General:  Well developed, well nourished, no acute distress. Lungs:  Clear throughout to auscultation. Heart:  Regular rate and rhythm; no murmurs, rubs,  or bruits. Abdomen:  soft and nontender   Impression & Recommendations:  Problem # 1:  COLONIC POLYPS, ADENOMATOUS, HX OF (ICD-V12.72) He would like to schedule in first half of 2012. Place recall for or byMarch  Problem # 2:  ZENKER'S DIVERTICULUM (ICD-530.6) Assessment: Deteriorated having some dysphagia so should follow-up with Dr. Jenne Pane  Problem # 3:  CORONARY ARTERY DISEASE (ICD-414.00) on Plavix for stents, ask Myrtis Ser to clear holding  Patient Instructions: 1)  We will contact Dr. Myrtis Ser regarding your Plavix.  We will call you to let you know his recommendations 2)  Please call our office to schedule your Colonoscopy. 3)  Please return to see Dr. Jenne Pane regarding your swallowing problems. 4)  Copy sent to : Christia Reading, MD 5)  The medication list was reviewed and reconciled.  All changed / newly prescribed medications were explained.  A complete medication list was provided to the patient / caregiver.  Appended Document: discuss colon on plavix--ch. OK to hold Plavix for GI procedures.  I would prefer for him to stay on aspirin if possible.  Appended Document: discuss colon on plavix--ch. Heather, Dr. Leone Payor is away.  Please get data to the GI division.  Appended Document: discuss colon on plavix--ch. Contacted patient about Dr Henrietta Hoover recommendation to hold Plavix for 7 days prior to Colonoscopy, but remain on his ASA. Offered to schedule patient but he would rather wailt on for his Recall letter in March, 2012.

## 2010-09-28 NOTE — Letter (Signed)
Summary: Anticoagulation Modification Letter  [facility]  [address] [place], [place] [address]   Phone: [phone]  Fax: [phone]    August 11, 2010  Re:    [name] DOB:    02-09-1956 MRN:  [id number]    Dear Dr. Myrtis Ser:  We would like to schedule the above patient for a colonoscopy with Dr. Leone Payor. Our records show that  he is on anticoagulation therapy. Please advise as to how long the patient may come off their therapy of Plavix prior to any scheduled procedure.  Please append and route the completed form to Darcey Nora, Charity fundraiser.  Thank you for your help with this matter.  Sincerely,  Francee Piccolo CMA Duncan Dull)   Physician Recommendation:  Hold Plavix 7 days prior ________________  Hold Coumadin 5 days prior ____________  Other ______________________________

## 2010-09-28 NOTE — Consult Note (Signed)
Summary: [facility]  [facility]   Imported By: Lester [place] 09/21/2010 10:42:57  _____________________________________________________________________  External Attachment:    Type:   Image     Comment:   External Document

## 2010-10-10 ENCOUNTER — Telehealth (INDEPENDENT_AMBULATORY_CARE_PROVIDER_SITE_OTHER): Payer: Self-pay

## 2010-10-11 ENCOUNTER — Encounter: Payer: Self-pay | Admitting: Internal Medicine

## 2010-10-18 NOTE — Progress Notes (Signed)
Summary: ? coordinate colon with other procedures for Dr Jenne Pane   Phone Note Other Incoming Call back at [phone]   Caller: Elizebeth Brooking- Dr Jenne Pane Summary of Call: Dr Jenne Pane is planning on doing a zenkers repair under general at [facility] sometime in the next month or so.  They and the patient would like to know if colon could be done at the same time in the OR by you?  Please advise if this is possible.  His surgery days are usually  at Thursday or a Friday and occasionally a Monday. According to your last note patient is due for colon in the first half of 2012.  Patient should only have an overnight stay. Initial call taken by: Darcey Nora RN, CGRN,  October 10, 2010 3:53 PM  Follow-up for Phone Call        That is not possible. We may be able to do it around the same time (as he will need to be off Plavix). The colonoscopy is not urgent so could be deferred to later this year if needed.  Follow-up by: Iva Boop MD, Clementeen Graham,  October 11, 2010 8:27 AM  Additional Follow-up for Phone Call Additional follow up Details #1::        Tambra aware.  They will call back once they have the OR date and we will try and coordinate the colon around this time.  Additional Follow-up by: Darcey Nora RN, CGRN,  October 11, 2010 9:52 AM

## 2010-10-31 ENCOUNTER — Telehealth: Payer: Self-pay | Admitting: Internal Medicine

## 2010-11-07 NOTE — Progress Notes (Signed)
Summary: Triage   Phone Note From Other Clinic   Caller: Tambra @ Dr. Jenne Pane [phone] have her paged Call For: Dr. Leone Payor Summary of Call: Wants to know if she can sch'd COL directly. Pt. is on Plavix and will give pt. instructions to come off Plavix for 7 days Initial call taken by: Karna Christmas,  October 31, 2010 11:06 AM  Follow-up for Phone Call        They are planning his zenkers surgery on 3/30, would like colon close to it.  I have looked at your schedule and you have the pm off on 3/28 would you be willing to do a lunch case.  We can't add on any [facility] cases at that time because of EPIC.  Please advise Follow-up by: Darcey Nora RN, CGRN,  October 31, 2010 11:18 AM  Additional Follow-up for Phone Call Additional follow up Details #1::        Yes Iva Boop MD, [facility]  October 31, 2010 11:55 AM     Additional Follow-up for Phone Call Additional follow up Details #2::    I have him scheduled with [facility] at 12:30 11/22/10 .  I have left him a voicemail to call me back to schedule a pre-visit. Darcey Nora RN, [facility]  October 31, 2010 3:34 PM  Dr Leone Payor is holding his plavix for 5 days enough for you? Darcey Nora RN, [facility]  October 31, 2010 4:49 PM 5 days off Plavix ok. Iva Boop MD, [facility]  October 31, 2010 4:49 PM   Additional Follow-up for Phone Call Additional follow up Details #3:: Details for Additional Follow-up Action Taken: patient aware.  I have left Elizebeth Brooking a message that we have him set up. Additional Follow-up by: Darcey Nora RN, CGRN,  November 01, 2010 8:41 AM

## 2010-11-08 ENCOUNTER — Encounter: Payer: Self-pay | Admitting: Internal Medicine

## 2010-11-14 NOTE — Letter (Signed)
Summary: [facility] Instructions  [facility]  [address] [place], [place] [address]   Phone: [phone]  Fax: [phone]       [name]    [birth year]    MRN: [id number]        Procedure Day Dorna Bloom: Wednesday 11/22/2010     Arrival Time: 11:30 am     Procedure Time: 12:30 pm     Location of Procedure:                     _x _  [facility] ( Outpatient Registration)                        PREPARATION FOR COLONOSCOPY WITH MOVIPREP   Starting 5 days prior to your procedure Friday 3/23 do not eat nuts, seeds, popcorn, corn, beans, peas,  salads, or any raw vegetables.  Do not take any fiber supplements (e.g. Metamucil, Citrucel, and Benefiber).  THE DAY BEFORE YOUR PROCEDURE         DATE: Tuesday 3/27  1.  Drink clear liquids the entire day-NO SOLID FOOD  2.  Do not drink anything colored red or purple.  Avoid juices with pulp.  No orange juice.  3.  Drink at least 64 oz. (8 glasses) of fluid/clear liquids during the day to prevent dehydration and help the prep work efficiently.  CLEAR LIQUIDS INCLUDE: Water Jello Ice Popsicles Tea (sugar ok, no milk/cream) Powdered fruit flavored drinks Coffee (sugar ok, no milk/cream) Gatorade Juice: apple, white grape, white cranberry  Lemonade Clear bullion, consomm, broth Carbonated beverages (any kind) Strained chicken noodle soup Hard Candy                             4.  In the morning, mix first dose of MoviPrep solution:    Empty 1 Pouch A and 1 Pouch B into the disposable container    Add lukewarm drinking water to the top line of the container. Mix to dissolve    Refrigerate (mixed solution should be used within 24 hrs)  5.  Begin drinking the prep at 5:00 p.m. The MoviPrep container is divided by 4 marks.   Every 15 minutes drink the solution down to the next mark (approximately 8 oz) until the full liter is complete.   6.  Follow completed prep with 16 oz of clear liquid of your  choice (Nothing red or purple).  Continue to drink clear liquids until bedtime.  7.  Before going to bed, mix second dose of MoviPrep solution:    Empty 1 Pouch A and 1 Pouch B into the disposable container    Add lukewarm drinking water to the top line of the container. Mix to dissolve    Refrigerate  THE DAY OF YOUR PROCEDURE      DATE: Wednesday 3/27  Beginning at 7:30 a.m. (5 hours before procedure):         1. Every 15 minutes, drink the solution down to the next mark (approx 8 oz) until the full liter is complete.  2. Follow completed prep with 16 oz. of clear liquid of your choice.    3. You may drink clear liquids until 8:30 am (4 HOURS BEFORE PROCEDURE).   MEDICATION INSTRUCTIONS  Unless otherwise instructed, you should take regular prescription medications with a small sip of water   as early as possible the morning of  your procedure.    Stop taking Plavix 5 days before procedure.            OTHER INSTRUCTIONS  You will need a responsible adult at least 57 years of age to accompany you and drive you home.   This person must remain in the waiting room during your procedure.  Wear loose fitting clothing that is easily removed.  Leave jewelry and other valuables at home.  However, you may wish to bring a book to read or  an iPod/MP3 player to listen to music as you wait for your procedure to start.  Remove all body piercing jewelry and leave at home.  Total time from sign-in until discharge is approximately 2-3 hours.  You should go home directly after your procedure and rest.  You can resume normal activities the  day after your procedure.  The day of your procedure you should not:   Drive   Make legal decisions   Operate machinery   Drink alcohol   Return to work  You will receive specific instructions about eating, activities and medications before you leave.    The above instructions have been reviewed and explained to me by   Doristine Church RN II  November 08, 2010 2:42 PM ______________________    I fully understand and can verbalize these instructions _____________________________ Date _________

## 2010-11-14 NOTE — Miscellaneous (Signed)
Summary: [facility] Prep-pt scheduled for [facility]  Clinical Lists Changes  Medications: Added new medication of MOVIPREP 100 GM  SOLR (PEG-KCL-NACL-NASULF-NA ASC-C) As per prep instructions. - Signed Rx of MOVIPREP 100 GM  SOLR (PEG-KCL-NACL-NASULF-NA ASC-C) As per prep instructions.;  #1 x 0;  Signed;  Entered by: Doristine Church RN II;  Authorized by: Iva Boop MD, [facility];  Method used: Electronically to [facility] [address], [place], [place], [place]  [address], Ph: [phone], Fax: [phone] Allergies: Added new allergy or adverse reaction of * BEE STINGS Observations: Added new observation of ALLERGY REV: Done (11/08/2010 14:18) Added new observation of NKA: F (11/08/2010 14:18)    Prescriptions: MOVIPREP 100 GM  SOLR (PEG-KCL-NACL-NASULF-NA ASC-C) As per prep instructions.  #1 x 0   Entered by:   Doristine Church RN II   Authorized by:   Iva Boop MD, [facility]   Signed by:   Doristine Church RN II on 11/08/2010   Method used:   Electronically to        [facility] [address] (retail)       [address], [address]       [place], [place]  [address]       Ph: [phone]       Fax: [phone]   RxID:   [id number]

## 2010-11-15 ENCOUNTER — Ambulatory Visit (HOSPITAL_COMMUNITY)
Admission: RE | Admit: 2010-11-15 | Discharge: 2010-11-15 | Disposition: A | Discharge status: Home / Self Care | Payer: 59 | Source: Ambulatory Visit | Attending: Otolaryngology | Admitting: Otolaryngology

## 2010-11-15 ENCOUNTER — Telehealth: Payer: Self-pay | Admitting: Cardiology

## 2010-11-15 ENCOUNTER — Encounter (HOSPITAL_COMMUNITY)
Admission: RE | Admit: 2010-11-15 | Discharge: 2010-11-15 | Disposition: A | Discharge status: Home / Self Care | Payer: 59 | Source: Ambulatory Visit | Attending: Otolaryngology | Admitting: Otolaryngology

## 2010-11-15 ENCOUNTER — Other Ambulatory Visit (HOSPITAL_COMMUNITY): Payer: Self-pay | Admitting: Otolaryngology

## 2010-11-15 DIAGNOSIS — Z01812 Encounter for preprocedural laboratory examination: Secondary | ICD-10-CM | POA: Insufficient documentation

## 2010-11-15 DIAGNOSIS — Z01818 Encounter for other preprocedural examination: Secondary | ICD-10-CM

## 2010-11-15 LAB — CBC
MCH: 30 pg (ref 26.0–34.0)
MCHC: 34.2 g/dL (ref 30.0–36.0)
MCV: 87.8 fL (ref 78.0–100.0)
Platelets: 259 10*3/uL (ref 150–400)
RBC: 5 MIL/uL (ref 4.22–5.81)
RDW: 12.5 % (ref 11.5–15.5)

## 2010-11-15 LAB — BASIC METABOLIC PANEL
BUN: 14 mg/dL (ref 6–23)
CO2: 26 mEq/L (ref 19–32)
GFR calc non Af Amer: >60 mL/min (ref >60)
Glucose, Bld: 98 mg/dL (ref 70–99)
Potassium: 4.5 mEq/L (ref 3.5–5.1)

## 2010-11-15 NOTE — Telephone Encounter (Signed)
All Cardiac faxed to [facility] to [phone]/KM

## 2010-11-22 ENCOUNTER — Ambulatory Visit (HOSPITAL_COMMUNITY)
Admission: RE | Admit: 2010-11-22 | Discharge: 2010-11-22 | Disposition: A | Discharge status: Home / Self Care | Payer: 59 | Source: Ambulatory Visit | Attending: Internal Medicine | Admitting: Internal Medicine

## 2010-11-22 ENCOUNTER — Other Ambulatory Visit: Payer: Self-pay | Admitting: Internal Medicine

## 2010-11-22 DIAGNOSIS — K5730 Diverticulosis of large intestine without perforation or abscess without bleeding: Secondary | ICD-10-CM

## 2010-11-22 DIAGNOSIS — Z860100 Personal history of colon polyps, unspecified: Secondary | ICD-10-CM

## 2010-11-22 DIAGNOSIS — Z1211 Encounter for screening for malignant neoplasm of colon: Secondary | ICD-10-CM

## 2010-11-22 DIAGNOSIS — E785 Hyperlipidemia, unspecified: Secondary | ICD-10-CM | POA: Insufficient documentation

## 2010-11-22 DIAGNOSIS — D126 Benign neoplasm of colon, unspecified: Secondary | ICD-10-CM

## 2010-11-22 DIAGNOSIS — I10 Essential (primary) hypertension: Secondary | ICD-10-CM | POA: Insufficient documentation

## 2010-11-22 LAB — HM COLONOSCOPY

## 2010-11-24 ENCOUNTER — Observation Stay (HOSPITAL_COMMUNITY)
Admission: RE | Admit: 2010-11-24 | Discharge: 2010-11-24 | Disposition: A | Discharge status: Home / Self Care | Payer: 59 | Source: Ambulatory Visit | Attending: Otolaryngology | Admitting: Otolaryngology

## 2010-11-24 DIAGNOSIS — E669 Obesity, unspecified: Secondary | ICD-10-CM | POA: Insufficient documentation

## 2010-11-24 DIAGNOSIS — I10 Essential (primary) hypertension: Secondary | ICD-10-CM | POA: Insufficient documentation

## 2010-11-24 DIAGNOSIS — I2510 Atherosclerotic heart disease of native coronary artery without angina pectoris: Secondary | ICD-10-CM | POA: Insufficient documentation

## 2010-11-24 DIAGNOSIS — F17200 Nicotine dependence, unspecified, uncomplicated: Secondary | ICD-10-CM | POA: Insufficient documentation

## 2010-11-24 DIAGNOSIS — K225 Diverticulum of esophagus, acquired: Principal | ICD-10-CM | POA: Insufficient documentation

## 2010-11-27 ENCOUNTER — Encounter: Payer: Self-pay | Admitting: Internal Medicine

## 2010-11-27 NOTE — Progress Notes (Signed)
Quick Note:  Two diminutive adenomas Will plan on repeat recall colonoscopy 5 yrs - 10/2015 ______

## 2010-11-28 NOTE — Procedures (Signed)
Summary: Colonoscopy  Patient: Saqib Cazarez Note: All result statuses are Final unless otherwise noted.  Tests: (1) Colonoscopy (COL)   COL Colonoscopy           DONE     [facility]     [address] [place], [place]  [address]          COLONOSCOPY PROCEDURE REPORT          PATIENT:  Davie, Claud  MR#:  [id number]     BIRTHDATE:  June 19, 1956, 54 yrs. old  GENDER:  male     ENDOSCOPIST:  Iva Boop, MD, [facility]          PROCEDURE DATE:  11/22/2010     PROCEDURE:  Colonoscopy with snare polypectomy     ASA CLASS:  Class II     INDICATIONS:  surveillance and high-risk screening, history of     pre-cancerous (adenomatous) colon polyps 2 diminutive adenomas     removed 2006     MEDICATIONS:   Fentanyl 75 mcg, Versed 7 mg          DESCRIPTION OF PROCEDURE:   After the risks benefits and     alternatives of the procedure were thoroughly explained, informed     consent was obtained.  Digital rectal exam was performed and     revealed no abnormalities and normal prostate.   The     [id number]) endoscope was introduced through the anus and     advanced to the cecum, which was identified by both the appendix     and ileocecal valve, without limitations.  The quality of the prep     was excellent, using MoviPrep.  The instrument was then slowly     withdrawn as the colon was fully examined. Insertion 3:40 minutes     withdrawal: 15 minutes     <<PROCEDUREIMAGES>>          FINDINGS:  Two polyps were found. Cecum (5mm flat), sigmoid (4mm).     Polyps were snared without cautery. Retrieval was successful.     Moderate diverticulosis was found in the sigmoid colon.  This was     otherwise a normal examination of the colon.   Retroflexed views     in the rectum revealed no abnormalities.    The scope was then     withdrawn from the patient and the procedure completed.          COMPLICATIONS:  None     ENDOSCOPIC IMPRESSION:     1) Two polyps removed,  largest 5mm     2) Moderate diverticulosis in the sigmoid colon     3) Otherwise normal examination, excellent prep     4) Personal history of 2 diminutive adenomas removed 2006          REPEAT EXAM:  In for Colonoscopy, pending biopsy results.          Iva Boop, MD, [facility]          CC:  The Patient          n.     eSIGNED:   Iva Boop at 11/22/2010 01:11 PM          Rondel Jumbo, [id number]  Note: An exclamation mark (!) indicates a result that was not dispersed into the flowsheet. Document Creation Date: 11/22/2010 1:11 PM _______________________________________________________________________  (1) Order result status: Final Collection or observation date-time: 11/22/2010 13:04 Requested date-time:  Receipt  date-time:  Reported date-time:  Referring Physician:   Ordering Physician: Stan Head [phone]) Specimen Source:  Source: Launa Grill Order Number: [id number] Lab site:

## 2010-12-01 LAB — POCT I-STAT, CHEM 8
Creatinine, Ser: 0.9 mg/dL (ref 0.4–1.5)
HCT: 41 % (ref 39.0–52.0)
Hemoglobin: 13.9 g/dL (ref 13.0–17.0)
Potassium: 4 mEq/L (ref 3.5–5.1)
Sodium: 140 mEq/L (ref 135–145)

## 2010-12-01 LAB — TYPE AND SCREEN: Antibody Screen: NEGATIVE

## 2010-12-01 LAB — ABO/RH: ABO/RH(D): A POS

## 2010-12-05 ENCOUNTER — Encounter: Payer: Self-pay | Admitting: Internal Medicine

## 2010-12-05 ENCOUNTER — Ambulatory Visit (INDEPENDENT_AMBULATORY_CARE_PROVIDER_SITE_OTHER): Payer: 59 | Admitting: Internal Medicine

## 2010-12-05 VITALS — BP 120/70 | HR 66 | Temp 97.8°F | Ht 71.0 in | Wt 191.0 lb

## 2010-12-05 DIAGNOSIS — E785 Hyperlipidemia, unspecified: Secondary | ICD-10-CM

## 2010-12-05 DIAGNOSIS — I2510 Atherosclerotic heart disease of native coronary artery without angina pectoris: Secondary | ICD-10-CM

## 2010-12-05 DIAGNOSIS — Z0000 Encounter for general adult medical examination without abnormal findings: Secondary | ICD-10-CM

## 2010-12-05 DIAGNOSIS — I10 Essential (primary) hypertension: Secondary | ICD-10-CM

## 2010-12-05 LAB — HEPATIC FUNCTION PANEL
Bilirubin, Direct: 0.1 mg/dL (ref 0.0–0.3)
Total Bilirubin: 0.9 mg/dL (ref 0.3–1.2)

## 2010-12-05 LAB — LIPID PANEL
HDL: 44.1 mg/dL (ref >39.00)
LDL Cholesterol: 72 mg/dL (ref 0–99)
Total CHOL/HDL Ratio: 3
Triglycerides: 163 mg/dL — ABNORMAL HIGH (ref 0.0–149.0)
VLDL: 32.6 mg/dL (ref 0.0–40.0)

## 2010-12-05 LAB — PSA: PSA: 3.5 ng/mL (ref 0.10–4.00)

## 2010-12-05 NOTE — Progress Notes (Signed)
Subjective:    Patient ID: [name], male    DOB: 10-29-1955, 55 y.o.   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 History of colonic polyps   . CAD (coronary artery disease)   . HLD (hyperlipidemia)   . HTN (hypertension)   . Allergic rhinitis   . GERD (gastroesophageal reflux disease)   . Diverticulitis   . Bradycardia   . Hiatal hernia   . Hemorrhoids     Past Surgical History  Procedure Date  . Zenker's diverticulectomy endoscopic 08-2005    repeat in 2012  . Right shoulder 04-2009  . Left wrist 04-2009    Family History  Problem Relation Age of Onset  . Heart disease Father 54    MVA  . Hypertension Mother   . Coronary artery disease Mother   . Heart failure Mother   . Cancer Neg Hx     no prostate or colon cancer    History   Social History  . Marital Status: Divorced    Spouse Name: N/A    Number of Children: 2  . 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 Smoking status: Former Smoker    Types: Cigarettes    Quit date: 05/27/2006  . Smokeless tobacco: Not on file  . Alcohol Use: No  . Drug Use: No  . Sexually Active: Not on file   Other Topics Concern  . Not on file   Social History Narrative  . No narrative on file   Review of Systems  Constitutional: Negative for activity change and fatigue.       Weight is up 5# Wears seat belt  HENT: Positive for rhinorrhea and postnasal drip. Negative for hearing loss, dental problem and tinnitus.        Keeps up with dentist Satisfied with OTC allergy meds Mild sore throat now--may be related to cold environment  at work  Eyes: Negative for visual disturbance.       No loss of vision or diplopia  Respiratory: Positive for cough. Negative for chest tightness and shortness of breath.        Occ cough--not a big deal  Cardiovascular: Positive for palpitations. Negative for chest pain and leg swelling.       Occ palpitations at night  Gastrointestinal: Negative for nausea, vomiting, abdominal pain, constipation and blood in stool.       Occ heartburn--tums help (uses sporadically)  Genitourinary: Negative for urgency and decreased urine volume.       Occ dribbling Sporadic nocturia No ED  Musculoskeletal: Positive for arthralgias. Negative for back pain and joint swelling.       Improving right shoulder pain  Skin: Negative for rash.       No suspicious lesions  Neurological: Negative for dizziness, syncope, weakness, light-headedness and headaches.  Psychiatric/Behavioral: Negative for sleep disturbance and dysphoric mood. The patient is not nervous/anxious.        Objective:   Physical Exam  Constitutional: He is oriented to person, place, and time. He appears well-developed and well-nourished. No distress.  HENT:  Head: Normocephalic and atraumatic.  Mouth/Throat: Oropharynx is clear and moist. No oropharyngeal exudate.  Eyes: Conjunctivae are normal. Pupils are equal, round, and reactive to light.       Fundi benign  Neck: Normal range of motion. Neck supple. No thyromegaly present.  Cardiovascular: Normal rate, regular rhythm, normal heart sounds and intact distal pulses.  Exam reveals no gallop.   Pulmonary/Chest: Effort normal and breath sounds normal. No respiratory distress. He has no wheezes. He has no rales.  Abdominal: Soft. He exhibits no mass. There is no tenderness.  Musculoskeletal: He exhibits no edema and no tenderness.       Limited ROM in right shoulder--normal otherwise  Lymphadenopathy:    He has no cervical adenopathy.  Neurological: He is alert and oriented to person,  place, and time. He exhibits normal muscle tone.       No focal weakness  Skin: Skin is warm. No rash noted. No erythema.       Benign nevi  Psychiatric: He has a normal mood and affect. His behavior is normal. Judgment and thought content normal.          Assessment & Plan:

## 2010-12-26 ENCOUNTER — Encounter: Payer: Self-pay | Admitting: Cardiology

## 2010-12-26 DIAGNOSIS — K225 Diverticulum of esophagus, acquired: Secondary | ICD-10-CM | POA: Insufficient documentation

## 2010-12-26 DIAGNOSIS — I10 Essential (primary) hypertension: Secondary | ICD-10-CM | POA: Insufficient documentation

## 2010-12-26 DIAGNOSIS — I2510 Atherosclerotic heart disease of native coronary artery without angina pectoris: Secondary | ICD-10-CM | POA: Insufficient documentation

## 2010-12-26 DIAGNOSIS — R001 Bradycardia, unspecified: Secondary | ICD-10-CM | POA: Insufficient documentation

## 2010-12-26 DIAGNOSIS — R9430 Abnormal result of cardiovascular function study, unspecified: Secondary | ICD-10-CM | POA: Insufficient documentation

## 2010-12-26 DIAGNOSIS — E785 Hyperlipidemia, unspecified: Secondary | ICD-10-CM | POA: Insufficient documentation

## 2010-12-26 DIAGNOSIS — I340 Nonrheumatic mitral (valve) insufficiency: Secondary | ICD-10-CM | POA: Insufficient documentation

## 2010-12-26 DIAGNOSIS — K219 Gastro-esophageal reflux disease without esophagitis: Secondary | ICD-10-CM | POA: Insufficient documentation

## 2010-12-27 ENCOUNTER — Ambulatory Visit (INDEPENDENT_AMBULATORY_CARE_PROVIDER_SITE_OTHER): Payer: 59 | Admitting: Cardiology

## 2010-12-27 ENCOUNTER — Encounter: Payer: Self-pay | Admitting: Cardiology

## 2010-12-27 DIAGNOSIS — K225 Diverticulum of esophagus, acquired: Secondary | ICD-10-CM

## 2010-12-27 DIAGNOSIS — I2510 Atherosclerotic heart disease of native coronary artery without angina pectoris: Secondary | ICD-10-CM

## 2010-12-27 DIAGNOSIS — I059 Rheumatic mitral valve disease, unspecified: Secondary | ICD-10-CM

## 2010-12-27 DIAGNOSIS — I498 Other specified cardiac arrhythmias: Secondary | ICD-10-CM

## 2010-12-27 DIAGNOSIS — I340 Nonrheumatic mitral (valve) insufficiency: Secondary | ICD-10-CM

## 2010-12-27 DIAGNOSIS — I10 Essential (primary) hypertension: Secondary | ICD-10-CM

## 2010-12-27 DIAGNOSIS — R001 Bradycardia, unspecified: Secondary | ICD-10-CM

## 2010-12-27 NOTE — Assessment & Plan Note (Signed)
Heart rate is stable. No change in therapy. 

## 2010-12-27 NOTE — Assessment & Plan Note (Signed)
Coronary disease is stable.  No further workup is needed.  Calcium back in one year.  We will consider an exercise test at that time.

## 2010-12-27 NOTE — Assessment & Plan Note (Signed)
Mitral regurgitation was mild.  We'll consider followup echo next year.

## 2010-12-27 NOTE — Assessment & Plan Note (Signed)
He has now had surgery on 2 occasions.  The most recent was March, 2012.

## 2010-12-27 NOTE — Progress Notes (Signed)
HPI Patient is seen for follow up of coronary disease.  He is doing very well.  I had seen him in November, 2011.  There was questionable carotid bruit at that time.  Carotid Doppler was done and it is normal.  He is not have any significant chest pain.  He has rare indigestion and rest.  This is not an anginal equivalent for him. Allergies  Allergen Reactions  . Bee Venom     REACTION: anaphylaxis    Current Outpatient Prescriptions  Medication Sig Dispense Refill  . aspirin 81 MG tablet Take 81 mg by mouth daily.        Marland Kitchen atorvastatin (LIPITOR) 40 MG tablet Take 40 mg by mouth daily.        . cetirizine (ZYRTEC) 10 MG tablet Take 10 mg by mouth daily.        . Cholecalciferol (VITAMIN D3) 2000 UNITS TABS Take 1 tablet by mouth daily.        . clopidogrel (PLAVIX) 75 MG tablet Take 75 mg by mouth daily.        . Coenzyme Q10 (CO Q 10) 100 MG CAPS Take 1 tablet by mouth daily.        Marland Kitchen lisinopril (PRINIVIL,ZESTRIL) 20 MG tablet Take 20 mg by mouth daily.        . nitroGLYCERIN (NITROSTAT) 0.4 MG SL tablet Place 0.4 mg under the tongue every 5 (five) minutes as needed.        . Omega-3 Fatty Acids (ULTRA OMEGA-3 FISH OIL) 1400 MG CAPS Take 1 capsule by mouth daily.        Marland Kitchen DISCONTD: cholecalciferol (VITAMIN D) 1000 UNITS tablet Take 1,000 Units by mouth daily.        Marland Kitchen DISCONTD: loratadine (CLARITIN) 10 MG tablet Take 10 mg by mouth daily.        Marland Kitchen DISCONTD: Omega-3 Fatty Acids (FISH OIL) 1360 MG CAPS Take 1 capsule by mouth daily.         History   Social History  . Marital Status: Divorced    Spouse Name: N/A    Number of Children: 2  . 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 Smoking status: Former Smoker    Types: Cigarettes    Quit date: 05/27/2006  . Smokeless tobacco: Not on file  . Alcohol Use: No  . Drug Use: No  . Sexually Active: Not on file   Other Topics Concern  . Not on file   Social History  Narrative  . No narrative on file    Family History  Problem Relation Age of Onset  . Heart disease Father 65    MVA  . Hypertension Mother   . Coronary artery disease Mother   . Heart failure Mother   . Cancer Neg Hx     no prostate or colon cancer    Past Medical History  Diagnosis Date  . History of colonic polyps   . CAD (coronary artery disease)     Cypher Stent RCA and circumflex 2003( long-term Plavix and aspirin) / nuclear 2008, no ischemia, couplets and triplets in recovery  . Dyslipidemia   . HTN (hypertension)   . Allergic rhinitis   . GERD (gastroesophageal reflux disease)   . Zenker's diverticulum     Bleed and surgical repair  . Bradycardia     Relative bradycardia related to PACs and PVCs when measuring heart  rate, benign, effects ability to measure blood pressure  . Hiatal hernia   . Hemorrhoids   . Ejection fraction     EF 55-60%, echo, November, 2008  . Mitral regurgitation     Mild, echo, 2008  . Carotid artery disease     Doppler, and November, 2011, normal carotid arteries, patent vertebrals    Past Surgical History  Procedure Date  . Zenker's diverticulectomy endoscopic 08-2005    repeat in 2012  . Right shoulder 04-2009  . Left wrist 04-2009    ROS  Patient denies fever, chills, headache, sweats, rash, change in vision, change in hearing, chest pain, cough, nausea vomiting, urinary symptoms.  All other systems are reviewed and are negative.  PHYSICAL EXAM Patient is oriented to person time and place.  Affect is normal.  There is no xanthelasma.  There is no jugular venous distention.  Lungs are clear.  Respiratory effort is not labored.  Cardiac exam reveals S1-S2.  There are no clicks or significant murmurs.  Abdomen is soft.  There is no peripheral edema. Filed Vitals:   12/27/10 1502  BP: 122/72  Height: 5\' 11"  (1.803 m)  Weight: 193 lb (87.544 kg)    EKG EKG is done today and reviewed by me.  It is normal.  ASSESSMENT & PLAN

## 2010-12-27 NOTE — Assessment & Plan Note (Signed)
Blood pressure is under good control. No change in therapy 

## 2010-12-27 NOTE — Patient Instructions (Signed)
Your physician wants you to follow-up in: 1 year. You will receive a reminder letter in the mail two months in advance. If you don't receive a letter, please call our office to schedule the follow-up appointment.  

## 2010-12-27 NOTE — Op Note (Signed)
[name], [name] NO.:  [id number]  MEDICAL RECORD NO.:  [id number]           PATIENT TYPE:  O  LOCATION:  5123                         FACILITY:  [facility]  PHYSICIAN:  Antony Contras, MD     DATE OF BIRTH:  February 12, 1956  DATE OF PROCEDURE:  11/24/2010 DATE OF DISCHARGE:  11/24/2010                              OPERATIVE REPORT   PREOPERATIVE DIAGNOSIS:  Zenker diverticulum.  POSTOPERATIVE DIAGNOSIS:  Zenker diverticulum.  PROCEDURE:  Revision endoscopic Zenker diverticulotomy.  SURGEON:  Antony Contras, MD  ANESTHESIA:  General endotracheal anesthesia.  COMPLICATIONS:  None.  INDICATIONS:  The patient is a 55 year old white male who underwent an endoscopic Zenker diverticulotomy in 2007 with good results symptomatically.  However, he has developed worsening dysphagia of late and a barium swallow shows reformation of significant Zenker diverticulum.  Thus, he presents to the operating room for revision surgery.  FINDINGS:  Upon exposure of the esophagus, the posteriorly directed pouch was identified with a common wall between it and the esophagus that was a bit center than one might expect but still fairly typical for a Zenker diverticulum.  A couple of embedded staples were sent toward the right side.  There was no abnormality to the mucosa or food debris in the pouch.  DESCRIPTION OF PROCEDURE:  The patient was identified in the holding room.  After informed consent having been obtained including discussion of risks, benefits, alternatives, the patient was brought to the operative suite and put on the operating table in supine position. Anesthesia was induced and the patient was intubated by the Anesthesia Team without difficulty.  The patient was given intravenous antibiotics during the case.  The eyes were taped closed and bed was turned 90 degrees from anesthesia.  A tooth guard was placed and the Weerda bivalve pharyngoscope was then placed  into the upper esophagus to find the pouch.  This was difficult, so it was removed and a cervical esophagoscope was then placed and easily identified the pouch in the esophagus.  Thus, the [facility] scope was replaced and placed into position exposing the common wall between the valves.  The valves were opened further exposing the wall.  A scope had to be placed at the absolute deepest possible position.  At this point, attempts were made to place a suture through the common wall that would allow for superior retraction of the wall during stapling, but this was not successful.  Thus, the endoscopic stapler was then placed across the common wall and engaged including dividing between the staple line.  The stapler was then removed.  Engaging the stapler was done while viewing with a 0-degree telescope.  After this was completed, the area was suctioned and examined and appeared to be a good result.  Thus, the pharyngoscope was taken out of suspension and removed from the patient's mouth.  He was turned back to Anesthesia for wake-up and was extubated and moved to the recovery room in stable condition.     Antony Contras, MD     DDB/MEDQ  D:  11/24/2010  T:  11/25/2010  Job:  [id number]  Electronically Signed by Christia Reading MD on 12/27/2010 06:20:03 PM

## 2011-01-08 ENCOUNTER — Other Ambulatory Visit: Payer: Self-pay | Admitting: Cardiology

## 2011-01-09 NOTE — Assessment & Plan Note (Signed)
[facility]                            CARDIOLOGY OFFICE NOTE   Roy, Kramer                        MRN:          [id number]  DATE:12/29/2007                            DOB:          [birth year]    [name] is here for follow-up.  He is doing very well.  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  This gives him a slow  perceived resting heart rate.  He has no symptoms from this, and it is  of no clinical significance for him.  We know that he has no marked  ischemia with exercise testing, and we know his LV function is remaining  good.  He exercises on a regular basis and is having no trouble.  There  is no syncope or presyncope.  This does affect his ability to get an  accurate blood pressure, but there is no sign that he is hypertensive.  He has brought me a long list of blood pressure checks, and in general,  his systolic is well-controlled from 120-140.  He does have coronary  disease that is stable.   The patient did have some localized left lateral chest discomfort that  was fleeting in nature recently.  He was able to exercise through this.  This was different from his original ischemic chest pain.  Originally,  he had a chest pressure with radiation under both arms.   PAST MEDICAL HISTORY:  ALLERGIES:  NO KNOWN DRUG ALLERGIES.   MEDICATIONS:  1. Lipitor 40.  2. Plavix 75.  3. Aspirin 81.  4. Multivitamin.  5. Lisinopril 10.  6. Coenzyme Q.  7. Fish oil.  8. Vitamin D.   OTHER MEDICAL PROBLEMS:  See the list below.   REVIEW OF SYSTEMS:  He really is doing well, and he has no significant  complaints.  Otherwise, review of systems is negative.   PHYSICAL EXAMINATION:  Blood pressure today is in the range of 155/80.  Note that his blood pressure when checked many, many other times shows a  systolic no higher than 140.  Weight is 184 pounds.  The patient is oriented to person, time and place.  Affect is normal.  HEENT:  Reveals no xanthelasma.  He has normal extraocular motion.  There are no carotid bruits.  There is no jugular venous distention.  LUNGS:  Are clear.  Respiratory effort is not labored.  CARDIAC EXAM:  Reveals a S1 with a S2.  There are no clicks or  significant murmurs.  The abdomen is soft.  He has no significant peripheral edema.   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  His underlying QRS reveals some  increased voltage.  On his echo in November 2008, there was no evidence  of left ventricular hypertrophy.   PROBLEMS INCLUDE:  1. History of coronary disease with Cypher stent to the right coronary      and to of the circumflex in 2003.  He remains on aspirin and      Plavix.  2. Hyperlipidemia, and this is being treated.  His  LDL is in the 60-70      range followed carefully by [name].  3. History of smoking that he stopped.  4. Gastroesophageal reflux disease.  5. History of a Zenker's diverticulum with a bleed and surgery to      repair the Zenker's in the past.  6. Blood pressure.  I believe that it is under control.  No further      changes in his medicines.  7. Ventricular ectopy.  We have thought about this at great length.      No change in the approach to the ectopy.  I believe that it is not      clinically significant.  8. Question of bradycardia.  This is related to the fact that he has      ventricular ectopy.  No change in his medications.  He is doing      well.  I will see him back in 6 months.     Roy Abed, MD, [facility]  Electronically Signed    JDK/MedQ  DD: 12/29/2007  DT: 12/29/2007  Job #: [id number]   cc:   Roy Schwalbe, MD

## 2011-01-09 NOTE — Assessment & Plan Note (Signed)
[facility]                            CARDIOLOGY OFFICE NOTE   [name], [name]                        MRN:          [id number]  DATE:05/05/2007                            DOB:          10-23-1955    [name] is here for followup.  I had seen him in August of 2008.  In  my note, I reviewed his coronary status.  He appeared to be stable.  He  had some slight short of breath, and I was not concerned overly at that  time.  The patient had some ventricular bigeminy.  We decided ultimately  to arrange for a Holter monitor.  He is very observant and has been  taking his pulse and blood pressure, and he actually did it at the same  time that he wore his monitor.  I have carefully reviewed the monitor.  He has some PVCs with some bigeminy.  There was a question of couplets  and triplets, but these were not clearly real in my mind.  His average  heart rate was 62 to 87.  His lowest recorded heart rate on the monitor  was 48.  However, the patient's status suggests that his heart rate gets  into the 30s.  I suspect that this is his measurement, at times when he  is having PVCs that are not measured.  I am not convinced that he is  having symptomatic bradycardia.  In fact, he is not having any  significant symptoms.   The patient is going about full activity.  He also tells me that at  times he will feel a brief pound in his chest.  I explained to him that  this was most probably post-PVC beat.  He also says he may have some  mild shortness of breath.   PAST MEDICAL HISTORY:  Allergies:  NO KNOWN DRUG ALLERGIES.  Medications:  Lipitor, Plavix, aspirin, multivitamin and lisinopril.   OTHER MEDICAL PROBLEMS:  See the list below.   REVIEW OF SYSTEMS:  Overall, he is feeling well.  See the HPI for any  concerns that he has.  Otherwise, his review of systems is negative.   PHYSICAL EXAMINATION:  VITAL SIGNS:  Blood pressure today is 145/71 with  a pulse of  62.  Weight is 183 pounds.  GENERAL:  The patient is oriented to person, time and place.  Affect is  normal.  HEENT:  Reveals no xanthelasma.  He has normal extraocular motion.  There are no carotid bruits.  There is no jugular venous distention.  LUNGS:  Clear.  Respiratory effort is not labored.  CARDIAC:  Exam reveals an S1 with an S2.  He has no clicks or  significant murmurs.  ABDOMEN:  Soft.  He has no masses or bruits.  EXTREMITIES:  He has no peripheral edema.   The monitor is described above.   PROBLEMS:  1. Coronary artery disease with interventions in the past.  He has two      Cypher stents in his circumflex.  He remains on aspirin and Plavix.  After the current issues are resolved, I will probably stop his      Plavix.  2. Hyperlipidemia, followed by Dr. Alphonsus Sias.  3. History of smoking that he stopped.  4. History of GERD.  5. History of Zenker's diverticulum with a bleed, ultimately surgery      to repair his Zenker's.  6. Question of elevated blood pressure.  He is on lisinopril now, and      his pressure is better.  He may still need more medication.  7. Slight sensation of shortness of breath.  Now, we need to proceed,      to be sure that he is not having ischemia.  His last exercise test      was in 2004.  8. Some ventricular bigeminy.  He is not having high-grade      arrhythmias.  9. Question of bradycardia.  See the discussion above.  I am not      convinced that he is having symptomatic bradycardia.  In addition,      I do not believe he has chronotropic incompetence.  However, we do      need to proceed now with an exercise test.  We will have him do a      stress Myoview, so that he walks on the treadmill, and we can      obtain exercise data and watch his ectopy at that time and rule out      any ongoing ischemia, and then I will see him back.     Luis Abed, MD, [facility]  Electronically Signed    JDK/MedQ  DD: 05/05/2007  DT: 05/06/2007  Job  #: [id number]   cc:   Karie Schwalbe, MD

## 2011-01-09 NOTE — Assessment & Plan Note (Signed)
[facility]                            CARDIOLOGY OFFICE NOTE   ZELIG, GACEK                        MRN:          [id number]  DATE:07/17/2007                            DOB:          16-Nov-1955    Mr. Stead is here for followup.  See my complete note from June 24, 2007.  At that time, we decided to proceed with a two-D echo to  carefully reassess his LV function.  The study was done.  He does have  PVCs at rest.  I read the study and he has good LV function.  He is back  today and he is stable.  His systolic pressure is a little higher than I  would like to see.  It is time to push his small dose of Lisinopril up  and he will increase from 5 to 10 mg daily.  He will then be back in  touch with [place] with some blood pressure followups, and we may adjust his  Lisinopril higher.  Otherwise, we will not change his medicines.   As per my last note of June 24, 2007, Mr. Roseboom and I have had  extensive discussions about all aspects of his care.  He is inquisitive  and is very pleasant.  I believe that he is stable.  I cannot explain  why he has more PVCs.  However, I am not inclined to change any of the  approach to his care.   The problems are listed on my note of June 24, 2007.   PAST MEDICAL HISTORY:   ALLERGIES:  No known drug allergies.   MEDICATIONS:  Lipitor, Plavix, aspirin, multivitamin, and Lisinopril  (dose to be increased from 5 to 10 mg daily).   OTHER MEDICAL PROBLEMS:  See the complete list on my note of June 24, 2007.   REVIEW OF SYSTEMS:  He is really feeling fine.   PHYSICAL EXAMINATION:  VITAL SIGNS:  Blood pressure today is 158/72.  His pulse is 40 because he has PVCs.  He has no significant symptoms.  CARDIAC:  An S1 with an S2.  There are no clicks or significant murmurs.  LUNGS:  Clear.   Problems are listed on the note of June 24, 2007.  Lisinopril dose is  to be increased.  No other change in his  medicines.     Luis Abed, MD, [facility]  Electronically Signed   JDK/MedQ  DD: 07/17/2007  DT: 07/17/2007  Job #: [id number]   cc:   Karie Schwalbe, MD

## 2011-01-09 NOTE — Assessment & Plan Note (Signed)
[facility]                            CARDIOLOGY OFFICE NOTE   DACE, DENN                        MRN:          [id number]  DATE:07/06/2008                            DOB:          01-09-1956    Mr. [name] is here for followup of his coronary disease, PVCs,  hypertension, and hyperlipidemia.  He follows also carefully with Dr.  Tillman Abide.  He is doing well and feeling well.  He is not having  any chest pain or shortness of breath.  He is going about full  activities.  He and I have talked at length about his PVCs and he is  doing well and tolerating them.  They did not appear to limit him.  We  continue also to look at his blood pressure.  In the past, he has  brought me blood pressure checks from home showing that his systolic is  well controlled.  However in the office, his pressure seems to be  elevated.  I want to be sure we are not missing the opportunity to be  sure that he is at goal.   PAST MEDICAL HISTORY:   ALLERGIES:  No known drug allergies.   MEDICATIONS:  Lipitor, Plavix, aspirin, multivitamin, lisinopril 10 (to  be increased to 20 mg daily), fish oil, and vitamin D.   OTHER MEDICAL PROBLEMS:  See the list on the note of Dec 29, 2007.   REVIEW OF SYSTEMS:  He has no GI or GU symptoms.  He has no headaches or  eye problems.  He is not having any skin rashes.   PHYSICAL EXAMINATION:  VITAL SIGNS:  Weight is 187 pounds.  We talked  about losing a few pounds.  Blood pressure in the office today is  155/95.  Pulse is 73.  GENERAL:  The patient is oriented to person, time, and place.  Affect is  normal.  HEENT:  No xanthelasma.  He has normal extraocular motion.  NECK:  There are no carotid bruits.  There is no jugular venous  distention.  LUNGS:  Clear.  Respiratory effort is not labored.  CARDIAC:  S1 with an S2.  There are no clicks or significant murmurs.  ABDOMEN:  Soft.  EXTREMITIES:  He has no peripheral  edema.   Problems include history of coronary disease, stable.  Hyperlipidemia,  his LDL is up slightly and he will watch his diet better.  Hypertension.  I am increasing his lisinopril to 20 and he will gather more blood  pressure information at home and fax it into [place].     Luis Abed, MD, [facility]  Electronically Signed    JDK/MedQ  DD: 07/06/2008  DT: 07/07/2008  Job #: [id number]

## 2011-01-09 NOTE — Assessment & Plan Note (Signed)
[facility]                            CARDIOLOGY OFFICE NOTE   NURI, LARMER                        MRN:          [id number]  DATE:04/03/2007                            DOB:          [birth year]    Mr. Hoobler is doing well. He had a non-ST elevation MI in 2003. PCI to  the right coronary was done and followed by PCI to the circumflex. He  did receive 1 Cipher stent. He has been on long term Plavix and he has  done well. He has seen Dr. Tillman Abide over the past year and in  fact, stopped smoking with the use of Wellbutrin and he is very pleased  with all of the care. He is not having any chest pain. He has no  exertional shortness of breath. He may have the rare sensation of an  incomplete inspiration, but this does not sound like exertional  shortness of breath. He is going about full activities. He has gained  some weight.   PAST MEDICAL HISTORY:   ALLERGIES:  No known drug allergies.   MEDICATIONS:  1. Lipitor 40.  2. Plavix 75.  3. Aspirin 81.  4. Multivitamin.  5. Lisinopril 5 mg daily.   OTHER MEDICAL PROBLEMS:  See the list below.   REVIEW OF SYSTEMS:  Overall, he feels well and his review of systems  otherwise is negative.   PHYSICAL EXAMINATION:  VITAL SIGNS:  Weight 181 pounds, blood pressure  130/80, pulse 70.  GENERAL:  The patient is oriented to person, time, and place. Affect is  normal.  HEENT:  Reveals no xanthelasma. He has normal extraocular motion.  NECK:  There are no carotid bruits. There is no jugular venous  distension.  LUNGS:  Clear. Respiratory effort is not labored.  CARDIAC:  Reveals an S1 with an S2. There are no clicks or significant  murmurs.  ABDOMEN:  Soft. He has no masses or bruits.  EXTREMITIES:  He has no significant peripheral edema and he has 2+  distal pulses.   EKG reveals sinus bradycardia. He does have some ventricular bigeminy at  rest. He has had no palpitation.   PROBLEMS:  1.  Coronary disease post interventions in the past. He remains on      aspirin on Plavix. The Plavix could eventually be stopped if      necessary, but I have chosen not to do so at this time.  2. Hyperlipidemia. This is followed carefully by Dr. Alphonsus Sias.  3. Long term Plavix use.  4. History of smoking that he has now stopped.  5. History of GERD.  6. History of a Zenker's diverticulum with a bleed and ultimately he      had surgery in the past to repair his Zenker's diverticulum.  7. Question of elevated blood pressure. He is low dose Lisinopril and      his blood pressure is good today.  8. Slight sensation of shortness of breath. I doubt that this is      cardiac in origin. He is stable. I have encouraged him to lose  a      few pounds.  9. Some bigeminy on his EKG. The patient is asymptomatic. I feel that      further work up of this is not necessary at this time.   I will see him back in one year for follow up.     Luis Abed, MD, [facility]  Electronically Signed    JDK/MedQ  DD: 04/03/2007  DT: 04/04/2007  Job #: [id number]   cc:   Karie Schwalbe, MD

## 2011-01-09 NOTE — Assessment & Plan Note (Signed)
[place]                            CARDIOLOGY OFFICE NOTE   [name], [name]                        MRN:          [id number]  DATE:06/24/2007                            DOB:          [birth year]    The patient is here for cardiology follow-up.  I have received  information from Karie Schwalbe, M.D.  Also I have reviewed my note  of May 05, 2007, when I saw the patient last.  There was question  of bradycardia.  I felt that this was related to the fact that he has  ventricular bigeminy at times and this effects the measuring of his  heart rate.  We decided to proceed with a stress Myoview.  During that  study, he exercised well for 11 minutes 30 seconds.  He had fatigue and  shortness of breath, but no chest pain.  There were nonspecific ST-T  wave changes.  He did have some increase in his ventricular ectopy with  a rare triplet.  He also had increase in blood pressure.  Because of the  ectopy, the study could not be gaited and wall motion and ejection  fraction data is not available.  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  Today the patient is feeling well, however, he continues to  discuss with me how we can be sure what his blood pressure is and why  his heart rate varies and is slower than expected.  See the discussion  below.   ALLERGIES:  No known drug allergies.   MEDICATIONS:  Lipitor, Plavix, aspirin, multivitamin, and lisinopril 5  mg.   PAST MEDICAL HISTORY:  See the list below.   REVIEW OF SYSTEMS:  He actually feels well, his review of systems is  negative.   PHYSICAL EXAMINATION:  VITAL SIGNS:  Blood pressure is at times  difficult to obtain because his rate is slow and because of his ectopy.  Systolic pressure recorded by our nursing team was 118.  When I  evaluated him later in the left arm his systolic was in the range of  725.  Diastolic  was not elevated.  His pulse appeared to be slow, but I  am sure he was in ventricular bigeminy at that time.  He has no  significant symptoms with this.  GENERAL:  The patient is oriented to  person, time, and place.  Affect is normal.  HEENT:  No xanthelasma.  He has normal extraocular motion.  There are no  carotid bruits.  There is no jugular venous distention.  LUNGS:  Clear.  Respiratory effort is not labored.  HEART:  S1 with S2.  There are no clicks or significant murmurs.  ABDOMEN:  Soft.  He has no masses or bruits.  He has no significant  peripheral edema.   We had a lengthy discussion about the approach to his problems.   PROBLEM LIST:  1. Coronary artery disease post interventions in the past.  He has two      Cypher stents  in his circumflex and he remains on aspirin and      Plavix and this will be continued for now.  2. Hyperlipidemia being treated.  3. History of smoking that he stopped.  4. History of gastroesophageal reflux disease.  5. History of Zenker's diverticulum with a bleed with surgery to      repair the Zenker's in the past.  6. Blood pressure.  He is on lisinopril now.  His pressure at rest is      under control.  His pressure with exercise is elevated.      Consideration could be given to a beta blocker, but with his      resting heart rate, he and I are hesitant.  7. Slightly sensation of shortness of breath when he begins to      exercise.  He can then continue and he feels well.  I am not      convinced that this represents ischemia at this time.  There is no      ischemia on the nuclear scan.  However, we must keep the ectopy in      mind.  8. Some ventricular bigeminy.  He did have a couplet and a few      triplets with stress.  I will look into obtaining all of the EKG      tracings for further review.  9. Question of bradycardia.  Once again I believe this is a function      of his bigeminy.  It is possible that low dose beta blockade might       help with this and in fact his pulse seemed higher.  I explained      this to him.  He would rather not have any other medicines.      Therefore after a very complete discussion, we decided not to      change any medicines.  However, since there is no wall motion data      available from the nuclear scan, we will proceed with a two-      dimensional echocardiogram to be sure that his ejection fraction      remains normal and that his valves remain normal.  I will then see      him back for further discussion.     Luis Abed, MD, [facility]  Electronically Signed    JDK/MedQ  DD: 06/24/2007  DT: 06/25/2007  Job #: [id number]   cc:   Karie Schwalbe, MD

## 2011-01-12 NOTE — Cardiovascular Report (Signed)
[place]  Patient:    [name], [name] Visit Number: [id number] MRN: [id number]          Service Type: MED Location: [phone] Attending Physician:  Talitha Givens Dictated by:   Salvadore Farber, M.D., [facility] Proc. Date: 03/16/02 Admit Date:  03/11/2002 Discharge Date: 03/17/2002   CC:         Paulino Rily, M.D.  Luis Abed, M.D. [facility]   Cardiac Catheterization  PROCEDURE: Stent to proximal circumflex, intravascular ultrasound of left anterior descending as part of ASTROID study.  INDICATION: The patient is a 55 year old man who suffered a non ST elevation myocardial infarction involving his right coronary artery on July 16. He was treated with primary angioplasty of the RCA with good result. Angiograms at that time demonstrated an 80% stenosis of his proximal circumflex. Having recovered from his myocardial infarction, he is now referred for intervention on the circumflex. In addition, he has consented to enrollment in the ASTROID trial and will therefore undergo intravascular ultrasound of the LAD.  INTERVENTIONAL TECHNIQUE: Under 2% lidocaine local anesthesia, a 6 French sheath was placed in the left femoral artery using the modified Seldinger technique. Anticoagulation was initiated with bivalirudin to achieve an ACT of greater than 250 seconds. A Voda left 3.5 guide was advanced over a wire and engaged in the left main ostium. A luge wire was then advanced across the lesion and into the distal circumflex artery. The lesion was then crossed and pre-dilated with a 2.5 x 15 mm Maverick at 6 atmospheres. It was then stented with a 2.5 x 18 mm Cypher at 14 atmospheres. The proximal edge of the stent was well apposed. However, there was substantial size mismatch. It was therefore post-dilated distally within the stent with a 3.25 x 15 mm Quantum at 14 atmospheres. Angiograms after removal of the wire demonstrated no residual  stenosis with TIMI-3 flow.  We then turned to the LAD. The luge wire was advanced to the distal LAD. An IVUS catheter was advanced to the mid LAD. IVUS images were obtained using automated pullback from the mid LAD back through the left main into the guide. Final angiogram demonstrated no change in the LAD or circumflex with TIMI-3 flow in each vessel.  COMPLICATIONS: None.  IMPRESSION: Successful stenting of the proximal circumflex resulting in no residual stenosis and TIMI-3 flow (2.4 x 18 mm Cypher deployed at 2.7 mm proximally and 3.25 mm distally). Successful intravascular ultrasound of the left anterior descending as part of the ASTROID study.  RECOMMENDATIONS: Will continue Plavix for six months and aspirin indefinitely. His statin will be dosed according to the ASTROID protocol. Dictated by:   Salvadore Farber, M.D., [facility] Attending Physician:  Talitha Givens DD:  03/16/02 TD:  03/20/02 Job: [id number]

## 2011-01-12 NOTE — H&P (Signed)
NAMECHRISTAN, DEFRANCO               ACCOUNT NO.:  [id number]   MEDICAL RECORD NO.:  [id number]          PATIENT TYPE:  INP   LOCATION:  4703                         FACILITY:  [facility]   PHYSICIAN:  Jefry H. Pollyann Kennedy, MD     DATE OF BIRTH:  05-28-1956   DATE OF ADMISSION:  09/06/2005  DATE OF DISCHARGE:                                HISTORY & PHYSICAL   REASON FOR ADMISSION:  Hemorrhage from Zenker's diverticulum.   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  He is scheduled to undergo a surgical correction of this with Dr. Karren Burly D.  Jenne Pane in about two weeks.  This evening he was eating pizza at about 10 p.m.  and about one-half an hour later after feeling a piece of food get stuck in  his diverticulum, started spitting up some fresh blood and some clots.  He  was evaluated here in the emergency room with a barium swallow, which  revealed a large stable Zenker's diverticulum without any signs of  perforation, and no extravasation of contrast into the neck or mediastinum.  He has a history of cardiac disease.  He had a myocardial infarction in  2003, for which he takes aspirin daily and Plavix.  He was scheduled to stop  his Plavix next week, in preparation for his surgery.  He has a history of  bleeding.  He had a tooth extracted several years ago, while he was also on  Plavix and had unusual amounts of bleeding from that as well.  He has no  known bleeding disorder.   MEDICATIONS:  1.  Lipitor.  2.  Aspirin.  3.  Plavix.  4.  Multivitamin.   PAST MEDICAL/SURGICAL HISTORY:  Otherwise unremarkable.   SOCIAL HISTORY:  He has a history of smoking, but minimal alcohol use.   PHYSICAL EXAMINATION:  GENERAL:  He is a healthy-appearing gentleman, in no  distress.  VITAL SIGNS:  Stable.  LUNGS:  He has no difficulty with respiration.  HEENT:  He is occasionally spitting up some blood and  some clots.  The oral  cavity and pharynx are clear.  The nasal cavity reveals a nasal septal  deviation.  A fiberoptic laryngoscopy performed reveals some blood staining  of the vallecula but no active bleeding seen in the larynx or hypopharynx.  During the endoscopy the patient was prompted to try to belch, an when he  did some blood and clot was seen arising from the esophageal introitus. He  was able to spit this out without difficulty.  NECK:  There are no palpable neck masses.   IMPRESSION:  Known Zenker's diverticulum with possibly some trauma from food  fragment causing some hemorrhage.   PLAN:  To admit to the hospital and keep n.p.o.  Start intravenous fluids  and check serial blood counts to assess the severity of the bleeding.  Clinically it does not seem to be severe.  If he does start to show a drop  in his hemoglobin, then we  may need to consider urgent surgical  intervention.  If the bleeding diminishes and eventually stops, then we can  allow him to go home and proceed with the planned surgery as previously  scheduled.      Jefry H. Pollyann Kennedy, MD  Electronically Signed     JHR/MEDQ  D:  09/07/2005  T:  09/07/2005  Job:  [id number]   cc:   Willa Rough, M.D.  [address]  [address]  [place]  [place] [address]

## 2011-01-12 NOTE — Cardiovascular Report (Signed)
[place]  Patient:    Roy, Kramer Visit Number: [id number] MRN: [id number]          Service Type: MED Location: [phone] Attending Physician:  Talitha Givens Dictated by:   Arturo Morton Riley Kill, M.D. [facility] Proc. Date: 03/11/02 Admit Date:  03/11/2002   CC:         CV Laboratory   Cardiac Catheterization  PERCUTANEOUS INTERVENTION REPORT  INDICATIONS:  [name] is a 55 year old gentleman who presents with chest pain.  He had hypotension in the emergency room.  CPK and MB turned out to be positive, as did an echo which demonstrated inferior and inferoseptal hypokinesis.  Because of this, he was brought emergently to the cath lab after being seen by Dr. Myrtis Ser.  PROCEDURES: 1. Left heart catheterization. 2. Selective coronary arteriography. 3. Selective left ventriculography. 4. Insertion of transvenous temporary pacer. 5. PCI of the right coronary artery.  DESCRIPTION OF PROCEDURE:  The patient was brought to the cath lab and prepped and draped in the usual fashion.  Through an anterior puncture, the right femoral artery was easily entered.  Views of the left and right coronary arteries were obtained in multiple angiographic projections.  Because of the patients hypotension, he continued to receive fluids.  With this, he was noted to have a total occlusion of the right coronary artery.  Because of this, we made plans for possible percutaneous intervention.  A temporary transvenous pacer was then passed through the RV apex, tested, and found to be adequate.  The pacer was turned off and kept on back up.  Heparin and Integrilin were given according to protocol and a JR-4 guiding catheter was used to intubate the right coronary artery.  The lesion was crossed with a 0.014 high-torque floppy wire and reperfusion achieved with TIMI-I flow after wire reperfusion.  We then dilated with a 2.5 mm Quantum Maverick balloon, and there was  fairly marked improvement in the appearance of the artery.  There was a fair amount of spasm throughout this somewhat diffusely diseased artery. Eventually the artery opened up and we gave small amounts of nitroglycerin at 50, 60, and 70 mcg.  Ultimately, the vessel was stented using a 2.75 x 12 stent.  An Express II stent by [employer] was utilized.  This was then post dilated using a short 3.25 Quantum Maverick balloon.  This was taken up to as high as 14 atmospheres.  There was marked improvement in the appearance of the artery over time.  The temporary wire and all catheters were subsequently removed, the femoral sheath sewn into place, and the patient taken to the coronary care unit in satisfactory clinical condition.  HEMODYNAMIC DATA:  The central aortic pressure initially was 97/67, LV pressure 106/11 with an LVEDP of 19.  There was no gradient wall pullback across the aortic valve.  ANGIOGRAPHIC DATA: 1. Ventriculography was performed in the RAO projection.  There was    hypokinesis of the inferobasal segment.  There was no mitral regurgitation.    Ejection fraction would be estimated in the range of about 50%. 2. The left main coronary artery was without critical disease. 3. The left anterior descending artery coursed to the apex.  There was mild    luminal irregularity throughout and about 30% narrowing after the    septal and diagonal. 4. The circumflex provided one major marginal branch.  There was about 70-80%    proximal narrowing followed by an area  of segmental plaquing of about 50%    overlying the origin of the AV circumflex.  The large marginal was without    critical disease more distally. 5. The right coronary artery had some spasm proximally, then 50% narrowing    near the junction of the proximal mid vessel and a total occlusion in the    mid vessel.  More distally after opening the artery up, there was mostly    a single PDA.  There were multiple areas of  moderate luminal narrowing in    the distal vessel approximating 30-40%.  There was reasonable runoff into    the PDA.  CONCLUSIONS: 1. Successful percutaneous stenting of the right coronary artery as described    above. 2. Moderate inferior wall hypokinesis. 3. Segmental disease of the circumflex coronary artery.  DISPOSITION: 1. The patient will be treated medically.  He may need percutaneous    stenting of the circumflex. 2. Aspirin and Plavix will be given. Dictated by:   Arturo Morton Riley Kill, M.D. [facility] Attending Physician:  Talitha Givens DD:  03/11/02 TD:  03/16/02 Job: [id number]

## 2011-01-12 NOTE — Assessment & Plan Note (Signed)
[facility]                              CARDIOLOGY OFFICE NOTE   [name], [name]                        MRN:          [id number]  DATE:04/16/2006                            DOB:          1956-07-03    [name] is doing well.  He had a non-ST elevation MI in July 2003.  He  had PCI to the right coronary and this was followed by elective PCI to the  circumflex.  One of his stents is a Cypher stent.  He had not had any  recurrent chest pain or shortness of breath since then.  He is going about  full activities.   He mentions that at work his blood pressure is mildly elevated in the range  of 140/90.  It seems that while work his pressure probably is elevated  intermittently.  He has not had any chest pain, syncope or presyncope.   PAST MEDICAL HISTORY:  Allergies:  No known drug allergies.   Medications:  1. Lipitor 40 mg.  2. Plavix 75 mg.  3. Aspirin 81 mg.  4. Multivitamin.   Other medical problems:  See the list below.  In addition, the patient had a  Zenker's diverticulum.  He was seen by GI and then ENT and was to have  surgery.  However, before the surgery he actually had a bleed from his  Zenker's related to some food.  Ultimately this was stabilized and he had  surgery and is doing well.   REVIEW OF SYSTEMS:  I mentioned that his blood pressure has been mildly  elevated at times.  He also asked today about smoking cessation and Chantix.  I told him that I am in favor of his trying Chantix through his primary care  doctor.  However, he needs to be very careful, that many patients have  nausea from it.   Otherwise, his review of systems is negative.   PHYSICAL EXAMINATION:  VITAL SIGNS:  Blood pressure is 124/80 with a pulse  of 67.  GENERAL:  The patient is oriented to person, time and place.  His affect is  normal.  CHEST:  Lungs are clear.  Respiratory effort is not labored.  HEENT:  No xanthelasma.  He has normal  extraocular motion.  There are no  carotid bruits.  There is no jugular venous distention.  CARDIAC:  An S1 with an S2.  There are no clicks or significant murmurs.  ABDOMEN:  Soft.  There are no masses or bruits.  EXTREMITIES:  There is no peripheral edema.   EKG reveals no significant change.   PROBLEM LIST:  1. Coronary disease, status post right coronary artery and circumflex      intervention in 2003.  One of them was a Cypher stent.  We know that he      has a good ejection fraction in 2004.  I decided not to push for any      exercise testing at this time.  2. Hyperlipidemia.  He is on Lipitor and has had good results, and a  fasting lipid profile has been drawn this morning.  3. Long-term Plavix use.  4. History of smoking.  The patient had stopped and it appears he will      need more help, however, going forward.  Chantix would be reasonable.  5. History of gastroesophageal reflux disease.  6. History of Zenker's diverticulum with a bleed and ultimately surgery      that repaired his Zenker's diverticulum.  7. Question of blood pressure elevations when he is at work.  Because of      this and because he has coronary disease, I feel it is appropriate for      him to be on a low dose of an ACE inhibitor.  Low-dose generic      lisinopril will be started today.   I will see him back in 1 year for follow-up.                               Luis Abed, MD, [facility]    JDK/MedQ  DD:  04/16/2006  DT:  04/16/2006  Job #:  [id number]   cc:   Loma Sender

## 2011-01-12 NOTE — H&P (Signed)
[place]  Patient:    [name], [name] Visit Number: [id number] MRN: [id number]          Service Type: MED Location: [phone] Attending Physician:  Cathren Laine Dictated by:   Delton See, P.A. Admit Date:  03/11/2002   CC:         Dr. Loma Sender   History and Physical  DATE OF BIRTH:  [birth year]  CHIEF COMPLAINT:  Chest pain.  HISTORY OF PRESENT ILLNESS:  This is a 55 year old male who presented to the [facility] Emergency Room for evaluation of chest pain.  He has no previous cardiac history.  The patient notes intermittent chest pain x7 weeks. He awoke at approximately midnight with chest pain radiating to both arms described as a substernal dull pressure, 8/10 last night, 3/10 when seen in the emergency room.  The pain waxed and waned all night and during the day. It came on with rest, but it has come on with activity in the past.  The patient has found nothing that really relieves the pain.  He has not had any nitroglycerin.  He was able to mow his yard without chest pain.  He does not get any regular exercise other than working around the house.  He denies shortness of breath.  He did feel diaphoretic and clammy.  He denied dizziness.  He had some nausea earlier on the day of admission.  In the emergency room he was noted to be bradycardic and hypotensive.  He was given fluids and his blood pressure did improve somewhat, however, it remained low during his time in the emergency room.  PAST MEDICAL HISTORY:  The patient has no history of hypertension, no history of diabetes.  His cholesterol has been elevated in the past.  He is not obese. He does have a history of smoking two packs of cigarettes per day for 30 years.  He notes a previous history of an "infection" in his heart.  ALLERGIES:  No known drug allergies.  MEDICATIONS PRIOR TO ADMISSION:  Aspirin and vitamins.  SOCIAL HISTORY:  The patient  is divorced, he has one child.  He lives in [place].  His wife is an intensive care paramedic.  He uses alcohol occasionally.  He does smoke as noted above.  He works for [employer].  FAMILY HISTORY:  The patients father died at age 58 from a motor vehicle accident; he had a history of an MI and coronary artery bypass graft surgery in his early-50s.  His mother is alive at age 27, she has hypertension.  He has one brother and two sisters.  PHYSICAL EXAMINATION:  GENERAL:  Revealed a 55 year old white male in no acute distress.  VITAL SIGNS:  Blood pressure ranged from 77/48 to 126/72, pulse ranged from the 40s to 90s, respirations were 20.  HEENT:  Unremarkable.  NECK:  Revealed no bruits, no jugular venous distention.  HEART:  Revealed a regular rate and rhythm without murmur, but distant heart sounds.  LUNGS:  Revealed decreased breath sounds.  SKIN:  Warm and dry.  ABDOMEN:  Soft, nontender.  EXTREMITIES:  Pulses intact without edema.  NEUROLOGICAL EXAM:  Grossly intact.  LABORATORY DATA:  A chest x-ray showed COPD.  An EKG showed sinus bradycardia, rate 50 with small inferior Q waves.  Chemistries revealed BUN 15, creatinine 0.9, potassium 4.  Hemoglobin was 15.4, hematocrit 45.7, WBC 15.8, platelets 275.  His initial CK was 801, the  troponin was pending as was the MB fraction.  IMPRESSION: 1. Chest pain, rule out myocardial infarction.  The plan was to take the    patient to the catheterization lab for further evaluation. 2. Elevated cholesterol levels. 3. History of tobacco. 4. Positive family history of coronary artery disease. 5. Hypotension. 6. Bradycardia. 7. Nausea. 8. Four beats of nonsustained ventricular tachycardia while in the emergency    room.  PLAN:  As noted, the plan was to take the patient to cardiac catheterization lab.  A stat echocardiogram was performed in the ER that showed some wall motion abnormalities.  The patient was  subsequently taken to the catheterization laboratory where he was found to have a total right coronary artery which was stented. Dictated by:   Delton See, P.A. Attending Physician:  Cathren Laine DD:  03/11/02 TD:  03/12/02 Job: [id number]

## 2011-01-12 NOTE — Op Note (Signed)
NAMEZAYAAN, KOZAK               ACCOUNT NO.:  [id number]   MEDICAL RECORD NO.:  [id number]          PATIENT TYPE:  OIB   LOCATION:  2550                         FACILITY:  [facility]   PHYSICIAN:  Antony Contras, MD     DATE OF BIRTH:  02-02-1956   DATE OF PROCEDURE:  09/19/2005  DATE OF DISCHARGE:                                 OPERATIVE REPORT   PREOPERATIVE DIAGNOSIS:  Zenker's diverticulum.   POSTOPERATIVE DIAGNOSIS:  Zenker's diverticulum.   PROCEDURE:  1.  Rigid esophagoscopy.  2.  Endoscopic staple diverticulotomy.   SURGEON:  Dr. Christia Reading.   ASSISTANT:  Dr. Lucky Cowboy.   ANESTHESIA:  General endotracheal anesthesia.   COMPLICATIONS:  None.   INDICATIONS:  The patient is a 55 year old white male with a long history of  dysphagia associated with regurgitation of food, sometimes hours later. He  is able to express food by pressing on the left side of the neck. Workup  ultimately revealed a large Zenker's diverticulum by barium swallow.  It was  first seen by endoscopy by his gastroenterologist. He had an episode of  bleeding a couple of weeks ago from his throat after swallowing some pizza  that ended up stopping spontaneously. He was on Plavix and aspirin at the  time. He presents the operating room today for surgical management and has  been off his Plavix and aspirin for a week.   SURGICAL FINDINGS:  There was an approximately 5 cm long diverticulum  extending from the posterior upper esophagus near the level of the  cricopharyngeus muscle. The diverticulum contained a few pieces of retained  food.  The lining of the diverticulum was smooth with no evidence of mucosal  irritation or tumor. The remainder the esophagus was also evaluated and  found to be normal.   DESCRIPTION OF PROCEDURE:  The patient is identified in the holding room and  informed consent having been obtained, the patient was moved to the  operative suite and put on the operating table in  supine position.  Anesthesia was induced. The patient was intubated by the anesthesia team.  The patient was given intravenous antibiotics during the case and the eyes  were taped closed. Bed was turned 90 degrees from anesthesia and a shoulder  roll was placed. Tooth guard was then placed and the upper esophagus  evaluated with a 10 mm cervical esophagoscope. Findings as noted below. A 7  mm esophagoscope was then inserted as well to look at the distal esophagus.  After this, Weerda pharyngoscope was inserted and placed with the upper  valve in the esophagus in the lower valve in the diverticulum. This was then  placed in suspension using Lewy arm on a Mayo stand. 2-0 silk suture was  then spaced through either side of the common wall using endoscopic needle  driver. These were used for retraction. An endoscopic TSB35 stapler was then  inserted through the [facility] scope with staple cartridge in the esophageal  lumen and the flange within the diverticulum. With retraction on the  sutures, the stapler was engaged and divided and then  removed. A second  cartridge was loaded and the same was done extending the incision further to  open the diverticulum. After  this, the staple line was carefully inspected with the 0 degree telescope  and found to be intact. At this point, the sutures were removed and the  [facility], [facility] scope was removed. The patient was turned back to anesthesia, wake  up, was extubated moved to recovery room in stable condition. The tooth  guard was removed prior to extubation.      Antony Contras, MD  Electronically Signed     DDB/MEDQ  D:  09/19/2005  T:  09/19/2005  Job:  [id number]

## 2011-01-12 NOTE — Discharge Summary (Signed)
[place]. [facility]  Patient:    [name], [name] Visit Number: [id number] MRN: [id number]          Service Type: MED Location: [phone] Attending Physician:  Talitha Givens Dictated by:   Pennelope Bracken, N.P. Admit Date:  03/11/2002 Disc. Date: 03/17/02   CC:         Luis Abed, M.D. [facility]  Raliegh Ip, M.D.   Discharge Summary  DATE OF BIRTH:  [birth year].  CARDIOLOGIST:  Luis Abed, M.D. PRIMARY CARE Ieasha Boerema:  Raliegh Ip, M.D.  REASON FOR ADMISSION:  Chest pain.  DISCHARGE DIAGNOSES: 1. Non-Q-wave myocardial infarction, status post sequenced percutaneous    coronary intervention to the right coronary artery and the proximal    circumflex with intracoronary vascular ultrasound of the left anterior    descending artery. 2. Hypotension, transient, this admission, requiring intravenous dopamine. 3. Tobacco abuse. 4. Dyslipidemia, low high-density lipoprotein. 5. Ejection fraction 40-50% by echocardiography on admission.  HISTORY OF PRESENT ILLNESS:  This delightful 55 year old gentleman with problems outlined above came to our attention by way of the emergency room. He had had intermittent chest pain for seven weeks and on day of admission awoke with chest pain radiating to bilateral arms.  This was a dull substernal pressure, 8/10 at onset and 3/10 in the emergency room.  The patient experienced four beats of nonsustained ventricular tachycardia while in the emergency room, and his CK-MB was elevated.  A stat echo was performed in the ER that showed wall motion abnormalities, so the patient was taken emergently to the catheterization laboratory, where he was found to have a total right coronary artery, which was stented.  HOSPITAL COURSE:  As above.  The patient tolerated the procedure well.  Other angiographic findings are as follows:  Circumflex had 70-80% disease proximally and 50% disease at  midvessel, and the LAD had 30% midvessel stenosis.  As mentioned above, the RCA was 100% blocked proximal and midvessel.  After placement of the first stent, the patient did have some transient hypotension with pressures dipping into the 80s.  He was placed on a dobutamine drip with quick resolution to systolics above 115.  The patient was continued on Plavix and Integrilin and a second PCI was scheduled.  He underwent a consultation for tobacco abuse and was begun on empiric Zocor therapy.  Beta block was initiated at low dose when the patients blood pressure could tolerate it.  The patient experienced no further chest pain.  He was enrolled in the ASTEROID study using _____.  Another percutaneous intervention was performed on July 21 by Dr. Samule Ohm, where the proximal circumflex was stented and an IVUS was performed of the LAD.  The plan was for the patient to be on Plavix for six months and aspirin indefinitely.  On day of discharge, Dr. Myrtis Ser found the patient to be in stable condition, and cardiac rehab was initiated.  DISCHARGE PHYSICAL EXAMINATION:  The day of discharge, the patient offered no complaints of chest pain or shortness of breath.  VITAL SIGNS:  Blood pressure 95/58, pulse 60, respirations 20, temperature afebrile, pulse oximetry 96% on room air.  GENERAL:  No acute distress.  CHEST:  Lungs clear to auscultation bilaterally.  CARDIAC:  Regular rate and rhythm without murmur, rub, or gallop.  EXTREMITIES:  Without cyanosis, clubbing, or edema.  Catheter site benign.  LABORATORY DATA:  A postprocedure 12-lead EKG was obtained, and this was within normal  limits.  Chest x-ray showed changes associated with COPD, and telemetry revealed normal sinus rhythm.  Postprocedure cardiac enzymes were also within normal limits.  Discharge chemistry:  Sodium 140, potassium 3.9, chloride 105, CO2 27, BUN 11, creatinine 1.0.  Discharge hemogram:  WBC 8.4, hemoglobin 12.8,  hematocrit 38.3, platelets 244.  DISPOSITION:  The patient was discharged to home in the care of his sister.  DISCHARGE MEDICATIONS: 1. Coated aspirin 325 mg q.d. 2. Plavix 75 mg one q.d. x6 months. 3. Wellbutrin 150 mg b.i.d. 4. Nitroglycerin 0.4 mg one sublingual every five minutes x3 p.r.n. chest    pain. 5. Toprol XL 12.5 mg one q.d. 6. ASTEROID study drug, _____, to be given by the research department.  DISCHARGE INSTRUCTIONS:  Activity restrictions:  The patient is not to return to work until he is seen by Dian Queen, P.A.C.  He agrees to participate in cardiac rehab and will abstain from heavy lifting, driving, for two weeks and sex for two days.  Diet recommended is a heart-healthy diet, and the components of this are being discussed with him by cardiac rehab.  Wound care: He agrees to call the office if his groin becomes hard or painful and will follow up with Dian Queen, P.A.C., August 5 at 11 oclock, and Dr. Myrtis Ser September 2 at 11 oclock.  He knows to call in the interim with any problems, questions, or concerns, or increase in symptoms. Dictated by:   Pennelope Bracken, N.P. Attending Physician:  Talitha Givens DD:  03/17/02 TD:  03/17/02 Job: [id number]

## 2011-05-11 NOTE — Discharge Summary (Signed)
  [name], [name] NO.:  [id number]  MEDICAL RECORD NO.:  [id number]  LOCATION:  5123                         FACILITY:  [facility]  PHYSICIAN:  Antony Contras, MD     DATE OF BIRTH:  May 23, 1956  DATE OF ADMISSION:  11/24/2010 DATE OF DISCHARGE:  11/24/2010                              DISCHARGE SUMMARY   ADMISSION DIAGNOSES:  Zenker diverticulum and dysphasia.  DISCHARGE DIAGNOSES:  Zenker diverticulum and dysphasia.  PROCEDURE:  Endoscopic Zenker diverticulotomy.  HISTORY OF PRESENT ILLNESS:  The patient is a 55 year old white male who in the past has had a Zenker diverticulum treated endoscopically with good results for a while.  However, he developed dysphasia again and repeat barium swallow imaging showed recurrence of the diverticulum. Thus, he presents to the operating room for surgical management.  HOSPITAL COURSE:  The patient was brought to the operating room for the above procedure.  For details of the procedure, please see the dictated operative note.  After surgery, he was admitted to a regular floor room and given pain medication and clear liquid diet.  By the evening, he was having no fever, chest pain, tachycardia and was tolerating liquids well.  Thus, he was felt stable for discharge home.  INSTRUCTIONS:  The patient was instructed to take a soft food and drink diet for 5 days and then advanced to a regular diet.  He was encouraged to increase activity slowly.  MEDICATIONS:  Lortab and Elixir.  FOLLOWUP:  With Dr. Jenne Pane in 2 weeks.     Antony Contras, MD     DDB/MEDQ  D:  03/21/2011  T:  03/22/2011  Job:  [id number]  Electronically Signed by Christia Reading MD on 05/11/2011 12:31:24 PM

## 2011-06-08 ENCOUNTER — Ambulatory Visit: Payer: 59 | Admitting: Internal Medicine

## 2011-06-12 ENCOUNTER — Encounter: Payer: Self-pay | Admitting: Internal Medicine

## 2011-06-12 ENCOUNTER — Ambulatory Visit (INDEPENDENT_AMBULATORY_CARE_PROVIDER_SITE_OTHER): Payer: 59 | Admitting: Internal Medicine

## 2011-06-12 VITALS — BP 129/79 | HR 74 | Temp 98.3°F | Ht 71.0 in | Wt 197.0 lb

## 2011-06-12 DIAGNOSIS — E785 Hyperlipidemia, unspecified: Secondary | ICD-10-CM

## 2011-06-12 DIAGNOSIS — I2510 Atherosclerotic heart disease of native coronary artery without angina pectoris: Secondary | ICD-10-CM

## 2011-06-12 DIAGNOSIS — I10 Essential (primary) hypertension: Secondary | ICD-10-CM

## 2011-06-12 DIAGNOSIS — K219 Gastro-esophageal reflux disease without esophagitis: Secondary | ICD-10-CM

## 2011-06-12 LAB — CBC WITH DIFFERENTIAL/PLATELET
Basophils Absolute: 0 10*3/uL (ref 0.0–0.1)
Basophils Relative: 0.5 % (ref 0.0–3.0)
Eosinophils Absolute: 0.1 10*3/uL (ref 0.0–0.7)
HCT: 45 % (ref 39.0–52.0)
Hemoglobin: 15.3 g/dL (ref 13.0–17.0)
Lymphs Abs: 2.9 10*3/uL (ref 0.7–4.0)
MCHC: 34 g/dL (ref 30.0–36.0)
Monocytes Relative: 7.2 % (ref 3.0–12.0)
Neutro Abs: 3.8 10*3/uL (ref 1.4–7.7)
RBC: 5 Mil/uL (ref 4.22–5.81)
RDW: 12.8 % (ref 11.5–14.6)

## 2011-06-12 LAB — HEPATIC FUNCTION PANEL
ALT: 84 U/L — ABNORMAL HIGH (ref 0–53)
Albumin: 4.5 g/dL (ref 3.5–5.2)
Alkaline Phosphatase: 72 U/L (ref 39–117)
Bilirubin, Direct: 0.1 mg/dL (ref 0.0–0.3)
Total Protein: 7.1 g/dL (ref 6.0–8.3)

## 2011-06-12 LAB — BASIC METABOLIC PANEL
CO2: 25 mEq/L (ref 19–32)
Glucose, Bld: 106 mg/dL — ABNORMAL HIGH (ref 70–99)
Potassium: 4.4 mEq/L (ref 3.5–5.1)
Sodium: 140 mEq/L (ref 135–145)

## 2011-06-12 LAB — LIPID PANEL: Total CHOL/HDL Ratio: 3

## 2011-06-12 NOTE — Progress Notes (Signed)
Subjective:    Patient ID: [name], male    DOB: 28-Mar-1956, 55 y.o.   MRN: [id number]  HPI Doing well No new concerns Has been slack on exercise---discussed  No chest pain No SOB No edema Did note racing heart in bed the other night----rapid rate for 30 seconds or so. No associated symptoms Has happened 3-4 times in past years  No headaches No dizziness  No myalgias Does note increased acid indigestion since repair of Zenkers Uses tums prn (tends to occur for several days in a row--then go away)  Current Outpatient Prescriptions on File Prior to Visit  Medication Sig Dispense Refill  . aspirin 81 MG tablet Take 81 mg by mouth daily.        . cetirizine (ZYRTEC) 10 MG tablet Take 10 mg by mouth daily.        . Cholecalciferol (VITAMIN D3) 2000 UNITS TABS Take 1 tablet by mouth daily.        . Coenzyme Q10 (CO Q 10) 100 MG CAPS Take 1 tablet by mouth daily.        Marland Kitchen LIPITOR 40 MG tablet TAKE ONE TABLET BY MOUTH EVERY DAY  30 each  10  . lisinopril (PRINIVIL,ZESTRIL) 20 MG tablet TAKE ONE TABLET BY MOUTH EVERY DAY  30 tablet  10  . nitroGLYCERIN (NITROSTAT) 0.4 MG SL tablet DISSOLVE ONE TABLET UNDER THE TONGUE EVERY 5 MINUTES AS NEEDED FOR CHEST PAIN.  DO NOT EXCEED A TOTAL OF 3 DOSES IN 15 MINUTES  25 tablet  10  . Omega-3 Fatty Acids (ULTRA OMEGA-3 FISH OIL) 1400 MG CAPS Take 1 capsule by mouth daily.        Marland Kitchen PLAVIX 75 MG tablet TAKE ONE TABLET BY MOUTH EVERY DAY  30 each  10    Allergies  Allergen Reactions  . Bee Venom     REACTION: anaphylaxis    Past Medical History  Diagnosis Date  . History of colonic polyps   . CAD (coronary artery disease)     Cypher Stent RCA and circumflex 2003( long-term Plavix and aspirin) / nuclear 2008, no ischemia, couplets and triplets in recovery  . Dyslipidemia   . HTN (hypertension)   . Allergic rhinitis   . GERD (gastroesophageal reflux disease)   . Zenker's diverticulum     Bleed and surgical repair  . Bradycardia     Relative bradycardia related to PACs and PVCs when measuring heart rate, benign, effects ability to measure blood pressure  . Hiatal hernia   . Hemorrhoids   . Ejection fraction     EF 55-60%, echo, November, 2008  . Mitral regurgitation     Mild, echo, 2008  . Carotid artery disease     Doppler, and November, 2011, normal carotid arteries, patent vertebrals    Past Surgical History  Procedure Date  . Zenker's diverticulectomy endoscopic 08-2005    repeat in 2012  . Right shoulder 04-2009  . Left wrist 04-2009    Family History  Problem Relation Age of Onset  . Heart disease Father 51    MVA  . Hypertension Mother   . Coronary artery disease Mother   . Heart failure Mother   . Cancer Neg Hx     no prostate or colon cancer    History   Social History  . Marital Status: Divorced    Spouse Name: N/A    Number of Children: 2  . 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 Smoking status: Former Smoker    Types: Cigarettes    Quit date: 05/27/2006  . Smokeless tobacco: Never Used  . Alcohol Use: No  . Drug Use: No  . Sexually Active: Not on file   Other Topics Concern  . Not on file   Social History Narrative  . No narrative on file   Review of Systems Sleeps okay Appetite is good Weight is stable or up a few pounds     Objective:   Physical Exam  Constitutional: He appears well-developed and well-nourished. No distress.  Neck: Normal range of motion. Neck supple. No thyromegaly present.  Cardiovascular: Normal rate, regular rhythm, normal heart sounds and intact distal pulses.  Exam reveals no gallop.   No murmur heard. Pulmonary/Chest: Effort normal and breath sounds normal. No respiratory distress. He has no wheezes. He has no rales.  Abdominal: Soft. There is no tenderness.  Musculoskeletal: Normal range of motion. He exhibits no edema and no tenderness.  Lymphadenopathy:    He has no cervical  adenopathy.  Psychiatric: He has a normal mood and affect. His behavior is normal. Judgment and thought content normal.          Assessment & Plan:

## 2011-06-12 NOTE — Assessment & Plan Note (Signed)
Lab Results  Component Value Date   LDLCALC 72 12/05/2010   No problems with statin

## 2011-06-12 NOTE — Assessment & Plan Note (Signed)
BP Readings from Last 3 Encounters:  06/12/11 129/79  12/27/10 122/72  12/05/10 120/70   Good control No changes needed Will check labs

## 2011-06-12 NOTE — Assessment & Plan Note (Signed)
occ spells of reflux Discussed using zantac when this happens

## 2011-06-12 NOTE — Assessment & Plan Note (Signed)
Has been quiet Follows with Dr Myrtis Ser

## 2011-07-11 ENCOUNTER — Other Ambulatory Visit: Payer: Self-pay | Admitting: Internal Medicine

## 2011-07-11 DIAGNOSIS — R7989 Other specified abnormal findings of blood chemistry: Secondary | ICD-10-CM

## 2011-07-11 DIAGNOSIS — R945 Abnormal results of liver function studies: Secondary | ICD-10-CM

## 2011-07-16 ENCOUNTER — Other Ambulatory Visit (INDEPENDENT_AMBULATORY_CARE_PROVIDER_SITE_OTHER): Payer: 59

## 2011-07-16 DIAGNOSIS — R945 Abnormal results of liver function studies: Secondary | ICD-10-CM

## 2011-07-16 DIAGNOSIS — R7989 Other specified abnormal findings of blood chemistry: Secondary | ICD-10-CM

## 2011-07-16 LAB — HEPATIC FUNCTION PANEL
Albumin: 4.4 g/dL (ref 3.5–5.2)
Alkaline Phosphatase: 67 U/L (ref 39–117)
Total Protein: 7.2 g/dL (ref 6.0–8.3)

## 2011-07-25 ENCOUNTER — Telehealth: Payer: Self-pay | Admitting: *Deleted

## 2011-07-25 NOTE — Telephone Encounter (Signed)
Message copied by Sueanne Margarita on Wed Jul 25, 2011 11:14 AM ------      Message from: Tillman Abide I      Created: Tue Jul 17, 2011  7:54 AM       Please call      The liver tests are better---just about back to normal.       Very reassuring      No changes needed

## 2011-07-25 NOTE — Telephone Encounter (Signed)
.  left message to have patient return my call.  

## 2011-07-25 NOTE — Telephone Encounter (Signed)
Spoke with patient and advised results   

## 2011-10-19 ENCOUNTER — Telehealth: Payer: Self-pay | Admitting: Cardiology

## 2011-10-19 DIAGNOSIS — E785 Hyperlipidemia, unspecified: Secondary | ICD-10-CM

## 2011-10-19 DIAGNOSIS — I2510 Atherosclerotic heart disease of native coronary artery without angina pectoris: Secondary | ICD-10-CM

## 2011-10-19 NOTE — Telephone Encounter (Signed)
Pt would like order for whatever blood work his needs prior to next visit 12-28-11 put in , would like 4-26, so he can go to [facility]

## 2011-10-19 NOTE — Telephone Encounter (Signed)
Patient called wanting to have lab work before he sees Dr.Katz in 5/13.States wants to have done at [facility].Advised Dr.Katz's nurse out of office today will call him back next week.Call back at work # [phone] or cell # [phone].

## 2011-10-19 NOTE — Telephone Encounter (Signed)
error 

## 2011-10-24 NOTE — Telephone Encounter (Signed)
Pt was notified that Dr Myrtis Ser is out of the office this week but I will check with him next week when he gets back.  Pt is ok with this.

## 2011-11-02 NOTE — Telephone Encounter (Signed)
Addended by: Worthy Rancher D on: 11/02/2011 12:57 PM   Modules accepted: Orders

## 2011-12-04 ENCOUNTER — Other Ambulatory Visit: Payer: Self-pay | Admitting: Cardiology

## 2011-12-21 ENCOUNTER — Other Ambulatory Visit (INDEPENDENT_AMBULATORY_CARE_PROVIDER_SITE_OTHER): Payer: 59

## 2011-12-21 DIAGNOSIS — I2510 Atherosclerotic heart disease of native coronary artery without angina pectoris: Secondary | ICD-10-CM

## 2011-12-21 DIAGNOSIS — E785 Hyperlipidemia, unspecified: Secondary | ICD-10-CM

## 2011-12-21 LAB — HEPATIC FUNCTION PANEL
ALT: 28 U/L (ref 0–53)
AST: 25 U/L (ref 0–37)
Albumin: 4.3 g/dL (ref 3.5–5.2)
Total Bilirubin: 0.8 mg/dL (ref 0.3–1.2)
Total Protein: 7 g/dL (ref 6.0–8.3)

## 2011-12-21 LAB — LIPID PANEL
Cholesterol: 154 mg/dL (ref 0–200)
HDL: 52.4 mg/dL (ref >39.00)
Triglycerides: 140 mg/dL (ref 0.0–149.0)
VLDL: 28 mg/dL (ref 0.0–40.0)

## 2011-12-28 ENCOUNTER — Encounter: Payer: Self-pay | Admitting: Cardiology

## 2011-12-28 ENCOUNTER — Ambulatory Visit (INDEPENDENT_AMBULATORY_CARE_PROVIDER_SITE_OTHER): Payer: 59 | Admitting: Cardiology

## 2011-12-28 VITALS — BP 118/74 | HR 70 | Ht 70.0 in | Wt 189.8 lb

## 2011-12-28 DIAGNOSIS — I2510 Atherosclerotic heart disease of native coronary artery without angina pectoris: Secondary | ICD-10-CM

## 2011-12-28 DIAGNOSIS — I10 Essential (primary) hypertension: Secondary | ICD-10-CM

## 2011-12-28 DIAGNOSIS — R001 Bradycardia, unspecified: Secondary | ICD-10-CM

## 2011-12-28 DIAGNOSIS — I498 Other specified cardiac arrhythmias: Secondary | ICD-10-CM

## 2011-12-28 NOTE — Assessment & Plan Note (Signed)
Coronary disease is stable. The patient exercises regularly with no symptoms. I've chosen not to proceed with any type of exercise test at this time. He remains on long-term aspirin and Plavix.

## 2011-12-28 NOTE — Progress Notes (Signed)
HPI Patient is seen for followup coronary disease. He's actually doing very well. He received a Cypher stent in 2003. Nuclear test in 2008 revealed no ischemia. He exercises regularly. He has no symptoms. There is no chest pain or shortness of breath. He has no syncope or presyncope.  Allergies  Allergen Reactions  . Bee Venom     REACTION: anaphylaxis    Current Outpatient Prescriptions  Medication Sig Dispense Refill  . aspirin 81 MG tablet Take 81 mg by mouth daily.        . cetirizine (ZYRTEC) 10 MG tablet Take 10 mg by mouth daily.        . cholecalciferol (VITAMIN D) 1000 UNITS tablet Take 1,000 Units by mouth daily.      . Coenzyme Q10 (CO Q 10) 100 MG CAPS Take 1 tablet by mouth daily.        Marland Kitchen LIPITOR 40 MG tablet TAKE ONE TABLET BY MOUTH EVERY DAY  30 each  10  . lisinopril (PRINIVIL,ZESTRIL) 20 MG tablet TAKE ONE TABLET BY MOUTH EVERY DAY  30 tablet  2  . nitroGLYCERIN (NITROSTAT) 0.4 MG SL tablet DISSOLVE ONE TABLET UNDER THE TONGUE EVERY 5 MINUTES AS NEEDED FOR CHEST PAIN.  DO NOT EXCEED A TOTAL OF 3 DOSES IN 15 MINUTES  25 tablet  10  . Omega-3 Fatty Acids (ULTRA OMEGA-3 FISH OIL) 1400 MG CAPS Take 1 capsule by mouth daily.        Marland Kitchen PLAVIX 75 MG tablet TAKE ONE TABLET BY MOUTH EVERY DAY  30 each  10    History   Social History  . Marital Status: Divorced    Spouse Name: N/A    Number of Children: 2  . 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 Smoking status: Former Smoker    Types: Cigarettes    Quit date: 05/27/2006  . Smokeless tobacco: Never Used  . Alcohol Use: No  . Drug Use: No  . Sexually Active: Not on file   Other Topics Concern  . Not on file   Social History Narrative  . No narrative on file    Family History  Problem Relation Age of Onset  . Heart disease Father 62    MVA  . Hypertension Mother   . Coronary artery disease Mother   . Heart failure Mother   . Cancer Neg Hx    no prostate or colon cancer    Past Medical History  Diagnosis Date  . History of colonic polyps   . CAD (coronary artery disease)     Cypher Stent RCA and circumflex 2003( long-term Plavix and aspirin) / nuclear 2008, no ischemia, couplets and triplets in recovery  . Dyslipidemia   . HTN (hypertension)   . Allergic rhinitis   . GERD (gastroesophageal reflux disease)   . Zenker's diverticulum     Bleed and surgical repair  . Bradycardia     Relative bradycardia related to PACs and PVCs when measuring heart rate, benign, effects ability to measure blood pressure  . Hiatal hernia   . Hemorrhoids   . Ejection fraction     EF 55-60%, echo, November, 2008  . Mitral regurgitation     Mild, echo, 2008  . Carotid artery disease     Doppler, and November, 2011, normal carotid arteries, patent vertebrals    Past Surgical History  Procedure Date  . Zenker's diverticulectomy endoscopic  08-2005    repeat in 2012  . Right shoulder 04-2009  . Left wrist 04-2009    ROS  Patient denies fever, chills, headache, sweats, rash, change in vision, change in hearing, chest pain, cough, nausea vomiting, urinary symptoms. All other systems are reviewed and are negative.  PHYSICAL EXAM Patient is oriented to person time and place. Affect is normal. There is no jugulovenous distention. Lungs are clear. Respiratory effort is nonlabored. Cardiac exam reveals S1 and S2. There no clicks or significant murmurs. The abdomen is soft. There is no peripheral edema.  Filed Vitals:   12/28/11 1523  BP: 118/74  Pulse: 70  Height: 5\' 10"  (1.778 m)  Weight: 189 lb 12.8 oz (86.093 kg)   EKG is done today and reviewed by me. There is no significant change. There is no significant abnormality.  ASSESSMENT & PLAN

## 2011-12-28 NOTE — Assessment & Plan Note (Signed)
Patient is a history of some bradycardia and some PACs and PVCs. He is not bothered by these. No further workup.

## 2011-12-28 NOTE — Assessment & Plan Note (Signed)
Blood pressures control. No change in therapy. 

## 2011-12-28 NOTE — Patient Instructions (Signed)
Your physician wants you to follow-up in: 1 year. You will receive a reminder letter in the mail two months in advance. If you don't receive a letter, please call our office to schedule the follow-up appointment.  

## 2012-01-03 ENCOUNTER — Other Ambulatory Visit: Payer: Self-pay | Admitting: Cardiology

## 2012-03-04 ENCOUNTER — Other Ambulatory Visit: Payer: Self-pay | Admitting: Cardiology

## 2012-06-13 ENCOUNTER — Ambulatory Visit (INDEPENDENT_AMBULATORY_CARE_PROVIDER_SITE_OTHER): Payer: 59 | Admitting: Internal Medicine

## 2012-06-13 ENCOUNTER — Encounter: Payer: Self-pay | Admitting: Internal Medicine

## 2012-06-13 VITALS — BP 122/70 | HR 71 | Temp 98.2°F | Ht 71.0 in | Wt 193.0 lb

## 2012-06-13 DIAGNOSIS — I2510 Atherosclerotic heart disease of native coronary artery without angina pectoris: Secondary | ICD-10-CM

## 2012-06-13 DIAGNOSIS — Z0000 Encounter for general adult medical examination without abnormal findings: Secondary | ICD-10-CM

## 2012-06-13 DIAGNOSIS — E785 Hyperlipidemia, unspecified: Secondary | ICD-10-CM

## 2012-06-13 LAB — CBC WITH DIFFERENTIAL/PLATELET
Basophils Absolute: 0 10*3/uL (ref 0.0–0.1)
Basophils Relative: 0.6 % (ref 0.0–3.0)
HCT: 46.4 % (ref 39.0–52.0)
Lymphocytes Relative: 38.1 % (ref 12.0–46.0)
MCV: 90.9 fl (ref 78.0–100.0)
Monocytes Relative: 7.4 % (ref 3.0–12.0)
Neutro Abs: 3.6 10*3/uL (ref 1.4–7.7)
RDW: 12.5 % (ref 11.5–14.6)
WBC: 6.9 10*3/uL (ref 4.5–10.5)

## 2012-06-13 LAB — BASIC METABOLIC PANEL
BUN: 14 mg/dL (ref 6–23)
CO2: 27 mEq/L (ref 19–32)
Chloride: 106 mEq/L (ref 96–112)
Creatinine, Ser: 0.8 mg/dL (ref 0.4–1.5)
Glucose, Bld: 97 mg/dL (ref 70–99)
Potassium: 4.4 mEq/L (ref 3.5–5.1)

## 2012-06-13 LAB — HEPATIC FUNCTION PANEL
Alkaline Phosphatase: 68 U/L (ref 39–117)
Bilirubin, Direct: 0 mg/dL (ref 0.0–0.3)
Total Bilirubin: 0.8 mg/dL (ref 0.3–1.2)
Total Protein: 6.9 g/dL (ref 6.0–8.3)

## 2012-06-13 LAB — LIPID PANEL
Cholesterol: 148 mg/dL (ref 0–200)
LDL Cholesterol: 79 mg/dL (ref 0–99)
Triglycerides: 143 mg/dL (ref 0.0–149.0)

## 2012-06-13 NOTE — Assessment & Plan Note (Signed)
Will recheck labs Not sure if he needs the fish oil---will check with Dr Myrtis Ser

## 2012-06-13 NOTE — Assessment & Plan Note (Signed)
Seems to be quiet Sees Dr Myrtis Ser

## 2012-06-13 NOTE — Progress Notes (Signed)
Subjective:    Patient ID: [name], male    DOB: May 05, 1956, 56 y.o.   MRN: [id number]  HPI Here for physical He checks his oximetry at home--notes 97-98% at rest but goes down some on elliptical. Lowest 92% though Some dyspnea when he pushes it May be related to how oximeter picks up the pulse  Questions his fish oil--with the recent publicity I wouldn't mind if he stops but he should review with Dr Myrtis Ser  Also questions his super B complex He stopped and his liver tests improved Not sure it is related  Okay to restart--perhaps not every day  No heart problems  Current Outpatient Prescriptions on File Prior to Visit  Medication Sig Dispense Refill  . aspirin 81 MG tablet Take 81 mg by mouth daily.        Marland Kitchen atorvastatin (LIPITOR) 40 MG tablet TAKE ONE TABLET BY MOUTH EVERY DAY  30 tablet  10  . cetirizine (ZYRTEC) 10 MG tablet Take 10 mg by mouth daily.        . cholecalciferol (VITAMIN D) 1000 UNITS tablet Take 1,000 Units by mouth daily.      . Coenzyme Q10 (CO Q 10) 100 MG CAPS Take 1 tablet by mouth daily.        Marland Kitchen lisinopril (PRINIVIL,ZESTRIL) 20 MG tablet TAKE ONE TABLET BY MOUTH EVERY DAY  90 tablet  3  . NITROSTAT 0.4 MG SL tablet DISSOLVE ONE TABLET UNDER THE TONGUE EVERY 5 MINUTES AS NEEDED FOR CHEST PAIN, DO NOT EXCEED A TOTAL OF 3 DOSES IN 15 MINUTES  25 each  6  . Omega-3 Fatty Acids (ULTRA OMEGA-3 FISH OIL) 1400 MG CAPS Take 1 capsule by mouth daily.        Marland Kitchen PLAVIX 75 MG tablet TAKE ONE TABLET BY MOUTH EVERY DAY  30 each  10    Allergies  Allergen Reactions  . Bee Venom     REACTION: anaphylaxis    Past Medical History  Diagnosis Date  . History of colonic polyps   . CAD (coronary artery disease)     Cypher Stent RCA and circumflex 2003( long-term Plavix and aspirin) / nuclear 2008, no ischemia, couplets and triplets in recovery  . Dyslipidemia   . HTN (hypertension)   . Allergic rhinitis   . GERD (gastroesophageal reflux disease)   . Zenker's  diverticulum     Bleed and surgical repair  . Bradycardia     Relative bradycardia related to PACs and PVCs when measuring heart rate, benign, effects ability to measure blood pressure  . Hiatal hernia   . Hemorrhoids   . Ejection fraction     EF 55-60%, echo, November, 2008  . Mitral regurgitation     Mild, echo, 2008  . Carotid artery disease     Doppler, and November, 2011, normal carotid arteries, patent vertebrals    Past Surgical History  Procedure Date  . Zenker's diverticulectomy endoscopic 08-2005    repeat in 2012  . Right shoulder 04-2009  . Left wrist 04-2009    Family History  Problem Relation Age of Onset  . Heart disease Father 23    MVA  . Hypertension Mother   . Coronary artery disease Mother   . Heart failure Mother   . Cancer Neg Hx     no prostate or colon cancer    History   Social History  . Marital Status: Divorced    Spouse Name: N/A    Number of  Children: 2  . 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 Smoking status: Former Smoker    Types: Cigarettes    Quit date: 05/27/2006  . Smokeless tobacco: Never Used  . Alcohol Use: No  . Drug Use: No  . Sexually Active: Not on file   Other Topics Concern  . Not on file   Social History Narrative  . No narrative on file   Review of Systems  Constitutional: Positive for unexpected weight change. Negative for fatigue.       Weight is up 4# Relates to change in routine with mom dying in April Uses seat belt  HENT: Negative for hearing loss, dental problem and tinnitus.        Regular with dentist Some PND-- uses the cetirizine  Eyes: Negative for visual disturbance.       No diplopia or unilateral vision loss  Respiratory: Positive for cough. Negative for chest tightness and shortness of breath.        Occ cough--- more at work  Cardiovascular: Positive for palpitations. Negative for chest pain and leg swelling.       Occ palps at  night--slight racing (brief)  Gastrointestinal: Negative for nausea, vomiting, abdominal pain, constipation and blood in stool.       Still occ notes a problem with swallowing (related to past Zenker's) Only heartburn is if he has hot salsa Takes TrueBiotic for his bowel health  Genitourinary: Negative for urgency, frequency and difficulty urinating.       No sex issues  Musculoskeletal: Negative for back pain, joint swelling and arthralgias.  Skin: Negative for rash.       No suspicious lesions  Neurological: Negative for dizziness, syncope, weakness, light-headedness, numbness and headaches.  Hematological: Negative for adenopathy. Bruises/bleeds easily.  Psychiatric/Behavioral: Negative for disturbed wake/sleep cycle and dysphoric mood. The patient is not nervous/anxious.        Objective:   Physical Exam  Constitutional: He is oriented to person, place, and time. He appears well-developed and well-nourished. No distress.  HENT:  Head: Normocephalic and atraumatic.  Right Ear: External ear normal.  Left Ear: External ear normal.  Mouth/Throat: Oropharynx is clear and moist. No oropharyngeal exudate.  Eyes: Conjunctivae normal and EOM are normal. Pupils are equal, round, and reactive to light.  Neck: Normal range of motion. Neck supple. No thyromegaly present.  Cardiovascular: Normal rate, regular rhythm, normal heart sounds and intact distal pulses.  Exam reveals no gallop.   No murmur heard. Pulmonary/Chest: Effort normal and breath sounds normal. No respiratory distress. He has no wheezes. He has no rales.  Abdominal: Soft. There is no tenderness.  Musculoskeletal: Normal range of motion. He exhibits no edema and no tenderness.  Lymphadenopathy:    He has no cervical adenopathy.  Neurological: He is alert and oriented to person, place, and time.  Skin: No rash noted. No erythema.       Scattered benign lesions  Psychiatric: He has a normal mood and affect. His behavior is  normal. Thought content normal.          Assessment & Plan:

## 2012-06-13 NOTE — Assessment & Plan Note (Signed)
Discussed weight gain---will work on fitness PSA reviewed---will defer to next year

## 2012-06-16 IMAGING — CR DG CHEST 2V
2 series · 2 of 2 positions shown · non-contrast
Comparison: None.

CLINICAL DATA: Preop chest x-ray

CHEST - 2 VIEW

[view not recorded (1 of 2)]
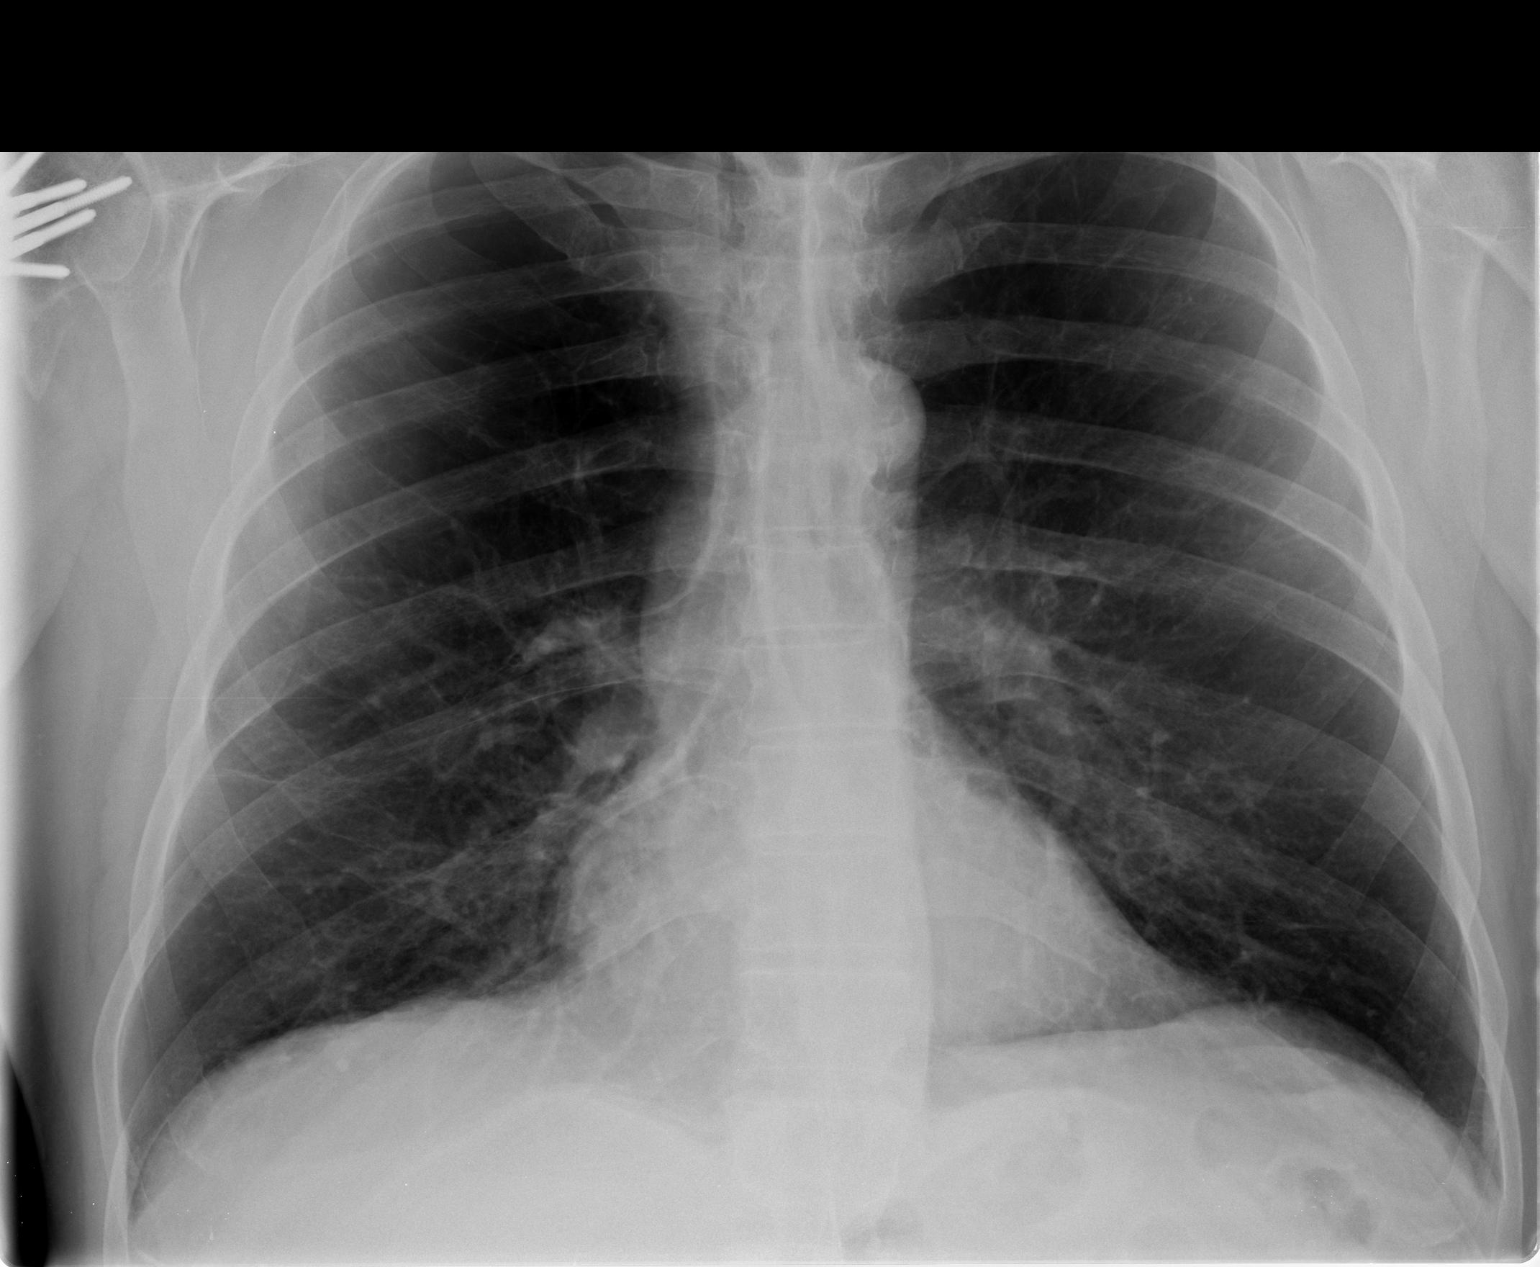

[view not recorded (2 of 2)]
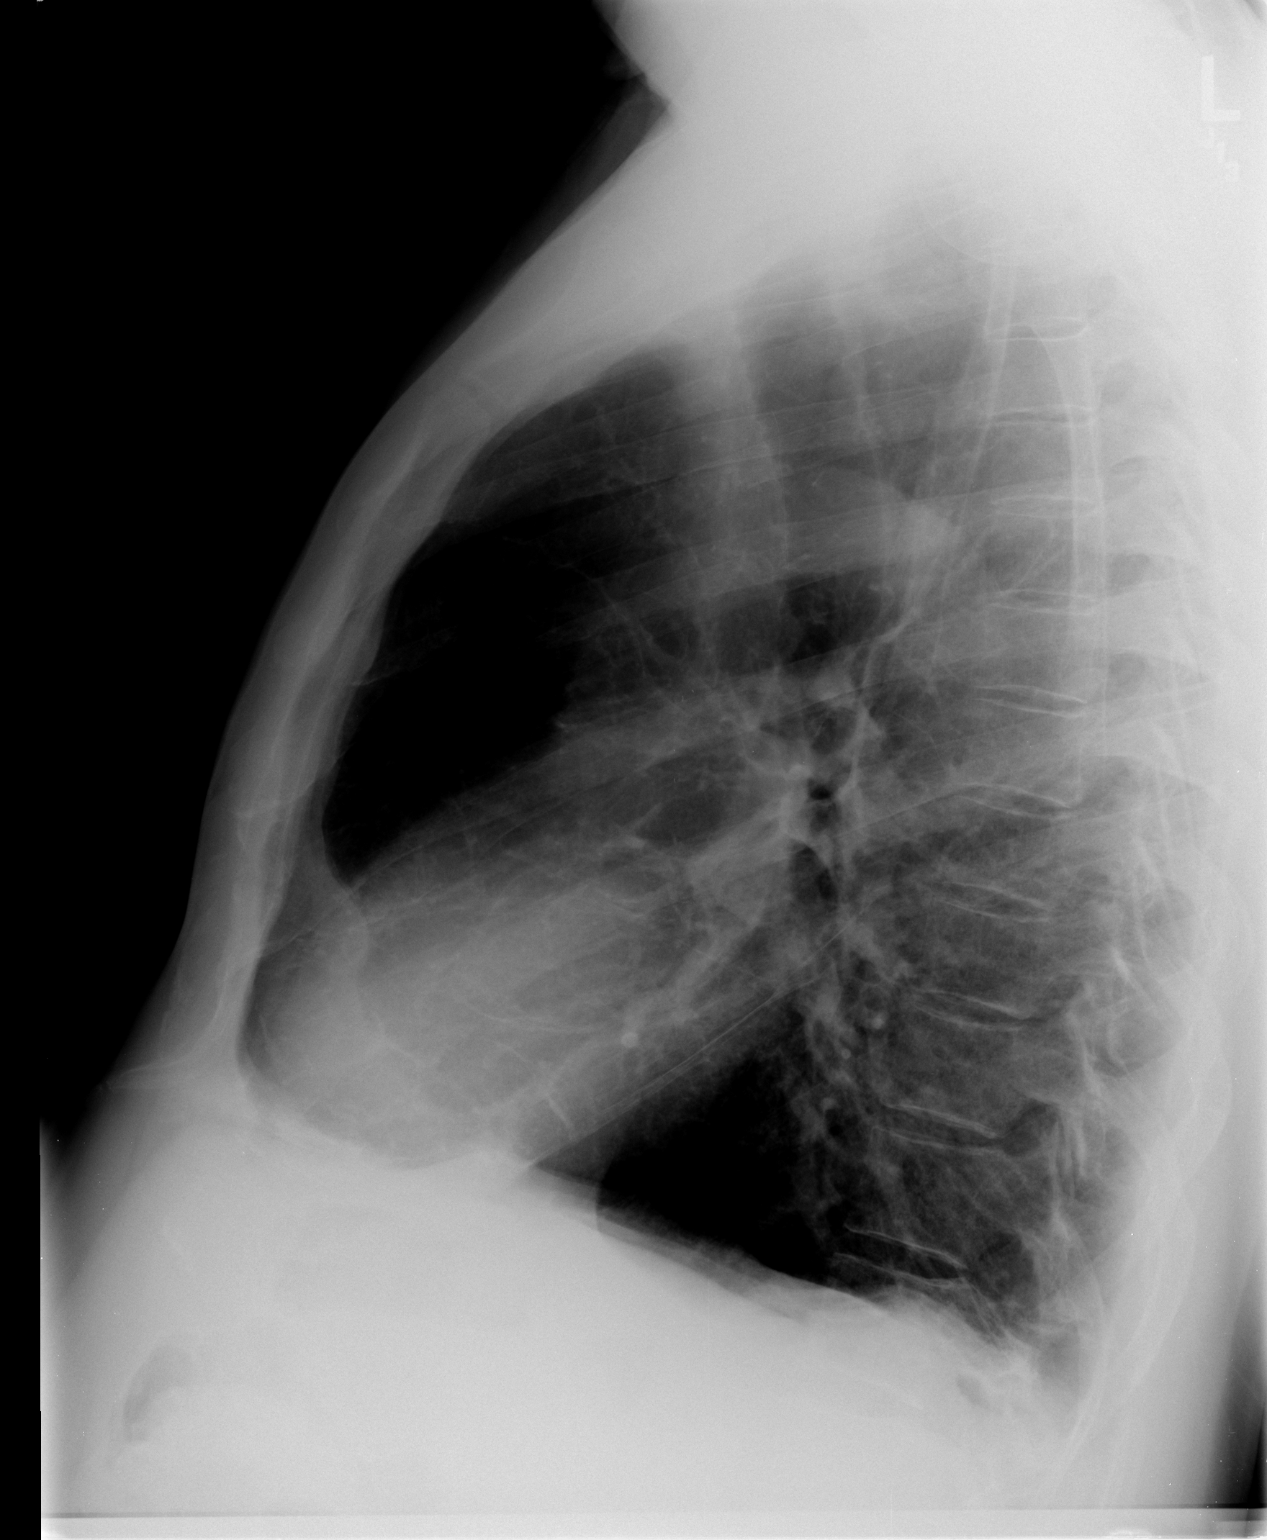

[2 of 2 positions shown; findings below may reference images not displayed]

FINDINGS: Heart size within normal limits.  There are advanced
changes of COPD.  There are apical blebs.  No active airspace
disease, pleural fluid, or heart failure.
IMPRESSION: Advanced changes of COPD - no acute findings.

## 2012-07-28 ENCOUNTER — Ambulatory Visit (INDEPENDENT_AMBULATORY_CARE_PROVIDER_SITE_OTHER): Payer: 59 | Admitting: Internal Medicine

## 2012-07-28 ENCOUNTER — Encounter: Payer: Self-pay | Admitting: Internal Medicine

## 2012-07-28 VITALS — BP 122/80 | HR 61 | Temp 98.4°F | Wt 195.0 lb

## 2012-07-28 DIAGNOSIS — J209 Acute bronchitis, unspecified: Secondary | ICD-10-CM

## 2012-07-28 MED ORDER — AMOXICILLIN 500 MG PO TABS: 1000.0000 mg | ORAL_TABLET | Freq: Two times a day (BID) | ORAL | Status: DC

## 2012-07-28 NOTE — Progress Notes (Signed)
Subjective:    Patient ID: [name], male    DOB: 02-11-1956, 56 y.o.   MRN: [id number]  HPI Started with cough ~4 days ago Felt like "glass" in chest Clear sputum at first--then changed to yellow 2 days ago Still has the yellow mucus Doesn't feel overly sick  No fever No sweats, chills or shakes No SOB No headaches Some head congestion Some throat soreness---thinks he is a little hoarse (from coughing) No otalgia  Hasn't take nay meds other than tylenol  Current Outpatient Prescriptions on File Prior to Visit  Medication Sig Dispense Refill  . aspirin 81 MG tablet Take 81 mg by mouth daily.        Marland Kitchen atorvastatin (LIPITOR) 40 MG tablet TAKE ONE TABLET BY MOUTH EVERY DAY  30 tablet  10  . cetirizine (ZYRTEC) 10 MG tablet Take 10 mg by mouth daily.        . cholecalciferol (VITAMIN D) 1000 UNITS tablet Take 1,000 Units by mouth daily.      . Coenzyme Q10 (CO Q 10) 100 MG CAPS Take 1 tablet by mouth daily.        Marland Kitchen lisinopril (PRINIVIL,ZESTRIL) 20 MG tablet TAKE ONE TABLET BY MOUTH EVERY DAY  90 tablet  3  . NITROSTAT 0.4 MG SL tablet DISSOLVE ONE TABLET UNDER THE TONGUE EVERY 5 MINUTES AS NEEDED FOR CHEST PAIN, DO NOT EXCEED A TOTAL OF 3 DOSES IN 15 MINUTES  25 each  6  . Omega-3 Fatty Acids (ULTRA OMEGA-3 FISH OIL) 1400 MG CAPS Take 1 capsule by mouth daily.        Marland Kitchen PLAVIX 75 MG tablet TAKE ONE TABLET BY MOUTH EVERY DAY  30 each  10    Allergies  Allergen Reactions  . Bee Venom     REACTION: anaphylaxis    Past Medical History  Diagnosis Date  . History of colonic polyps   . CAD (coronary artery disease)     Cypher Stent RCA and circumflex 2003( long-term Plavix and aspirin) / nuclear 2008, no ischemia, couplets and triplets in recovery  . Dyslipidemia   . HTN (hypertension)   . Allergic rhinitis   . GERD (gastroesophageal reflux disease)   . Zenker's diverticulum     Bleed and surgical repair  . Bradycardia     Relative bradycardia related to PACs and PVCs  when measuring heart rate, benign, effects ability to measure blood pressure  . Hiatal hernia   . Hemorrhoids   . Ejection fraction     EF 55-60%, echo, November, 2008  . Mitral regurgitation     Mild, echo, 2008  . Carotid artery disease     Doppler, and November, 2011, normal carotid arteries, patent vertebrals    Past Surgical History  Procedure Date  . Zenker's diverticulectomy endoscopic 08-2005    repeat in 2012  . Right shoulder 04-2009  . Left wrist 04-2009    Family History  Problem Relation Age of Onset  . Heart disease Father 41    MVA  . Hypertension Mother   . Coronary artery disease Mother   . Heart failure Mother   . Stroke Mother   . Cancer Neg Hx     no prostate or colon cancer    History   Social History  . Marital Status: Divorced    Spouse Name: N/A    Number of Children: 2  . 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 Smoking status: Former Smoker    Types: Cigarettes    Quit date: 05/27/2006  . Smokeless tobacco: Never Used  . Alcohol Use: No  . Drug Use: No  . Sexually Active: Not on file   Other Topics Concern  . Not on file   Social History Narrative  . No narrative on file   Review of Systems No rash Stomach felt "weak"---but no vomiting or diarrhea. Appetite was off     Objective:   Physical Exam  Constitutional: He appears well-developed and well-nourished. No distress.  HENT:  Mouth/Throat: Oropharynx is clear and moist. No oropharyngeal exudate.       Mild nasal inflammation TMs normal  Neck: Normal range of motion. Neck supple.  Pulmonary/Chest: Effort normal and breath sounds normal. No respiratory distress. He has no wheezes. He has no rales.  Lymphadenopathy:    He has no cervical adenopathy.          Assessment & Plan:

## 2012-07-28 NOTE — Assessment & Plan Note (Signed)
Seems to be viral Discussed supportive therapy If he worsens, will fill amoxicillin

## 2012-07-28 NOTE — Patient Instructions (Signed)
Please continue tylenol if needed and you can use honey or over the counter cough syrup if needed. If you worsen, or are not getting some better by the end of the week, fill and take the amoxicillin antibiotic

## 2012-12-01 ENCOUNTER — Telehealth: Payer: Self-pay | Admitting: Cardiology

## 2012-12-01 NOTE — Telephone Encounter (Signed)
New problem    Pt coming in for ov on 12/24/12 and pt want to go to [facility] to get his labs drawn. There were no orders in the system for labs. Please call pt in reference to this request.

## 2012-12-01 NOTE — Telephone Encounter (Signed)
Does Mr [name] need labs?  Last labs were done 06/13/12 and he had a cbc, lipid, bmet, tsh, liver.

## 2012-12-03 ENCOUNTER — Encounter: Payer: Self-pay | Admitting: Cardiology

## 2012-12-03 NOTE — Telephone Encounter (Signed)
Follow up     Labs drawn next week at [facility].

## 2012-12-03 NOTE — Telephone Encounter (Signed)
Notified pt that I will check with Dr Myrtis Ser tomorrow and let him know.  He is requesting a answer through mychart.

## 2012-12-04 NOTE — Telephone Encounter (Signed)
He does not need labs

## 2012-12-04 NOTE — Telephone Encounter (Signed)
N/A.  LMTC. 

## 2012-12-05 ENCOUNTER — Other Ambulatory Visit: Payer: Self-pay

## 2012-12-05 MED ORDER — ATORVASTATIN CALCIUM 40 MG PO TABS: ORAL_TABLET | ORAL | Status: DC

## 2012-12-05 NOTE — Telephone Encounter (Signed)
**Note De-identified Raelynne Ludwick Obfuscation** Pt advised, he verbalized understanding. 

## 2012-12-19 ENCOUNTER — Other Ambulatory Visit: Payer: Self-pay | Admitting: *Deleted

## 2012-12-24 ENCOUNTER — Ambulatory Visit (INDEPENDENT_AMBULATORY_CARE_PROVIDER_SITE_OTHER): Payer: 59 | Admitting: Cardiology

## 2012-12-24 ENCOUNTER — Encounter: Payer: Self-pay | Admitting: Cardiology

## 2012-12-24 VITALS — BP 139/88 | HR 73 | Ht 71.0 in | Wt 194.0 lb

## 2012-12-24 DIAGNOSIS — I2510 Atherosclerotic heart disease of native coronary artery without angina pectoris: Secondary | ICD-10-CM

## 2012-12-24 DIAGNOSIS — I779 Disorder of arteries and arterioles, unspecified: Secondary | ICD-10-CM

## 2012-12-24 DIAGNOSIS — I10 Essential (primary) hypertension: Secondary | ICD-10-CM

## 2012-12-24 DIAGNOSIS — E785 Hyperlipidemia, unspecified: Secondary | ICD-10-CM

## 2012-12-24 MED ORDER — NITROGLYCERIN 0.4 MG SL SUBL: 0.4000 mg | SUBLINGUAL_TABLET | SUBLINGUAL | Status: DC | PRN

## 2012-12-24 MED ORDER — LISINOPRIL 20 MG PO TABS: 20.0000 mg | ORAL_TABLET | Freq: Every day | ORAL | Status: DC

## 2012-12-24 MED ORDER — CLOPIDOGREL BISULFATE 75 MG PO TABS: 75.0000 mg | ORAL_TABLET | Freq: Every day | ORAL | Status: DC

## 2012-12-24 MED ORDER — ATORVASTATIN CALCIUM 40 MG PO TABS: ORAL_TABLET | ORAL | Status: DC

## 2012-12-24 NOTE — Assessment & Plan Note (Addendum)
The patient brought outside limited screening suggesting he may have some disease in his right carotid. We will plan to do a formal Doppler when I see him back in one year. He is on all appropriate therapy.  As part of today's evaluation I spent greater than 25 minutes with all of the patient's data. I spent more than half of this time with direct consultation with him concerning the labs and studies he brought me from the outside and the plans.

## 2012-12-24 NOTE — Assessment & Plan Note (Signed)
His blood pressure is slightly higher than I would like to see. He will have it checked on several occasions and will call us to see if any adjustments need to be made.

## 2012-12-24 NOTE — Assessment & Plan Note (Signed)
The patient's coronary disease is stable. I outlined fully above that he has been on long term Plavix and aspirin. However there is no indication for indefinite  Aspirin and Plavix. I will contact him to let him know that I feel that his Plavix can be stopped. No further testing is needed.

## 2012-12-24 NOTE — Progress Notes (Signed)
HPI  Patient is seen today to followup coronary disease. As part of today's evaluation I have researched the records although way back to his procedures in 2003. He did receive a drug-eluting stent back then. He did remain on aspirin and Plavix. He has been on these long-term. However after careful review there was never an indication that he needed aspirin and Plavix indefinitely. The patient asked me about this today leading to my complete review.  The patient has not been having any significant chest pain. He is fully active. He brought some outside screening studies  Today showing that his lipids were quite good. His LDL was 74. He had ABIs done that were normal. He did have carotid screening suggesting some mild stenosis of the right carotid. In 2011 a carotid Doppler done our office had shown no significant abnormalities.  Allergies  Allergen Reactions  . Bee Venom     REACTION: anaphylaxis    Current Outpatient Prescriptions  Medication Sig Dispense Refill  . aspirin 81 MG tablet Take 81 mg by mouth daily.        Marland Kitchen atorvastatin (LIPITOR) 40 MG tablet TAKE ONE TABLET BY MOUTH EVERY DAY  90 tablet  3  . cetirizine (ZYRTEC) 10 MG tablet Take 10 mg by mouth daily.        . clopidogrel (PLAVIX) 75 MG tablet Take 1 tablet (75 mg total) by mouth daily.  90 tablet  3  . Coenzyme Q10 (CO Q 10) 100 MG CAPS Take 1 tablet by mouth daily.        Marland Kitchen lisinopril (PRINIVIL,ZESTRIL) 20 MG tablet Take 1 tablet (20 mg total) by mouth daily.  90 tablet  3  . nitroGLYCERIN (NITROSTAT) 0.4 MG SL tablet Place 1 tablet (0.4 mg total) under the tongue every 5 (five) minutes as needed for chest pain.  25 tablet  5  . Omega-3 Fatty Acids (ULTRA OMEGA-3 FISH OIL) 1400 MG CAPS Take 1 capsule by mouth daily.         No current facility-administered medications for this visit.    History   Social History  . Marital Status: Divorced    Spouse Name: N/A    Number of Children: 2  . 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 Smoking status: Former Smoker    Types: Cigarettes    Quit date: 05/27/2006  . Smokeless tobacco: Never Used  . Alcohol Use: No  . Drug Use: No  . Sexually Active: Not on file   Other Topics Concern  . Not on file   Social History Narrative  . No narrative on file    Family History  Problem Relation Age of Onset  . Heart disease Father 21    MVA  . Hypertension Mother   . Coronary artery disease Mother   . Heart failure Mother   . Stroke Mother   . Cancer Neg Hx     no prostate or colon cancer    Past Medical History  Diagnosis Date  . History of colonic polyps   . CAD (coronary artery disease)     Cypher Stent RCA and circumflex 2003( long-term Plavix and aspirin) / nuclear 2008, no ischemia, couplets and triplets in recovery  . Dyslipidemia   . HTN (hypertension)   . Allergic rhinitis   . GERD (gastroesophageal reflux disease)   . Zenker's diverticulum     Bleed and surgical  repair  . Bradycardia     Relative bradycardia related to PACs and PVCs when measuring heart rate, benign, effects ability to measure blood pressure  . Hiatal hernia   . Hemorrhoids   . Ejection fraction     EF 55-60%, echo, November, 2008  . Mitral regurgitation     Mild, echo, 2008  . Carotid artery disease     Doppler, and November, 2011, normal carotid arteries, patent vertebrals    Past Surgical History  Procedure Laterality Date  . Zenker's diverticulectomy endoscopic  08-2005    repeat in 2012  . Right shoulder  04-2009  . Left wrist  04-2009    Patient Active Problem List   Diagnosis Date Noted  . Acute bronchitis 07/28/2012  . CAD (coronary artery disease)   . Dyslipidemia   . HTN (hypertension)   . GERD (gastroesophageal reflux disease)   . Zenker's diverticulum   . Bradycardia   . Ejection fraction   . Mitral regurgitation   . Routine general medical examination at a health care facility  12/05/2010  . HEMORRHOIDS 08/11/2010  . HIATAL HERNIA 08/11/2010  . COLONIC POLYPS, ADENOMATOUS, HX OF 08/11/2010  . ALLERGIC RHINITIS 11/28/2007    ROS   Patient denies fever, chills, headache, sweats, rash, change in vision, change in hearing, chest pain, cough, nausea vomiting, urinary symptoms. All other systems are reviewed and are negative.  PHYSICAL EXAM  Patient is oriented to person time and place. Affect is normal. There is no jugular venous distention. Lungs are clear. Respiratory effort is nonlabored. Cardiac exam reveals S1 and S2. There no clicks or significant murmurs. The abdomen is soft. There is no peripheral edema. There no musculoskeletal deformities. There no skin rashes.  Filed Vitals:   12/24/12 1439  BP: 139/88  Pulse: 73  Height: 5\' 11"  (1.803 m)  Weight: 194 lb (87.998 kg)     ASSESSMENT & PLAN

## 2012-12-24 NOTE — Assessment & Plan Note (Signed)
He brought some recent outside labs showing that his LDL remained at 74. No change in therapy.

## 2012-12-24 NOTE — Patient Instructions (Addendum)
**Note De-Identified Jatasia Gundrum Obfuscation** Your physician recommends that you continue on your current medications as directed. Please refer to the Current Medication list given to you today.  Your physician has requested that you have a carotid duplex. This test is an ultrasound of the carotid arteries in your neck. It looks at blood flow through these arteries that supply the brain with blood. Allow one hour for this exam. There are no restrictions or special instructions. To be done on same day as your next office visit.  Your physician wants you to follow-up in: 1 year. You will receive a reminder letter in the mail two months in advance. If you don't receive a letter, please call our office to schedule the follow-up appointment.  Please call [phone] and ask for Larita Fife, Dr. Myrtis Ser nurse, to report blood pressure readings.

## 2013-01-01 ENCOUNTER — Telehealth: Payer: Self-pay

## 2013-01-01 NOTE — Telephone Encounter (Signed)
**Note De-Identified Elray Dains Obfuscation** At last OV with Dr. Myrtis Ser on 12/24/12 pt was asked to monitor his BP at home and to call office with readings.   12/25/12 @ 9 am  BP= 138/86    12/26/12 @ 4:30 pm  BP= 140/86  12/29/12 @ 9 am  BP= 128/70  12/30/12 @ 3:30 pm  BP= 120/76  12/31/12 @ 4 pm  BP= 140/72  01/01/13 @ 2:30 pm BP= 128/82

## 2013-01-05 ENCOUNTER — Encounter: Payer: Self-pay | Admitting: Cardiology

## 2013-01-05 NOTE — Telephone Encounter (Signed)
Please let the patient know that I reviewed the blood pressures that he called in. These are stable. No change in therapy.

## 2013-01-05 NOTE — Progress Notes (Signed)
   The patient has been checking his blood pressure at home. He called his in. They range from 128/80-140/86. No change in therapy.

## 2013-01-07 NOTE — Telephone Encounter (Signed)
Left detailed message on pts VM. 

## 2013-06-16 ENCOUNTER — Encounter: Payer: Self-pay | Admitting: Internal Medicine

## 2013-06-16 ENCOUNTER — Ambulatory Visit (INDEPENDENT_AMBULATORY_CARE_PROVIDER_SITE_OTHER): Payer: 59 | Admitting: Internal Medicine

## 2013-06-16 VITALS — BP 122/80 | HR 61 | Temp 97.8°F | Ht 71.0 in | Wt 194.0 lb

## 2013-06-16 DIAGNOSIS — Z0000 Encounter for general adult medical examination without abnormal findings: Secondary | ICD-10-CM

## 2013-06-16 DIAGNOSIS — I2510 Atherosclerotic heart disease of native coronary artery without angina pectoris: Secondary | ICD-10-CM

## 2013-06-16 DIAGNOSIS — Z125 Encounter for screening for malignant neoplasm of prostate: Secondary | ICD-10-CM

## 2013-06-16 DIAGNOSIS — I10 Essential (primary) hypertension: Secondary | ICD-10-CM

## 2013-06-16 DIAGNOSIS — K225 Diverticulum of esophagus, acquired: Secondary | ICD-10-CM

## 2013-06-16 DIAGNOSIS — E785 Hyperlipidemia, unspecified: Secondary | ICD-10-CM

## 2013-06-16 LAB — CBC WITH DIFFERENTIAL/PLATELET
Basophils Relative: 0.4 % (ref 0.0–3.0)
Eosinophils Relative: 1.3 % (ref 0.0–5.0)
HCT: 45.5 % (ref 39.0–52.0)
Lymphs Abs: 2.2 10*3/uL (ref 0.7–4.0)
MCV: 89.1 fl (ref 78.0–100.0)
Monocytes Absolute: 0.6 10*3/uL (ref 0.1–1.0)
RBC: 5.11 Mil/uL (ref 4.22–5.81)
WBC: 7.5 10*3/uL (ref 4.5–10.5)

## 2013-06-16 LAB — HEPATIC FUNCTION PANEL
AST: 26 U/L (ref 0–37)
Albumin: 4.3 g/dL (ref 3.5–5.2)
Alkaline Phosphatase: 65 U/L (ref 39–117)
Bilirubin, Direct: 0.2 mg/dL (ref 0.0–0.3)
Total Bilirubin: 1 mg/dL (ref 0.3–1.2)
Total Protein: 6.8 g/dL (ref 6.0–8.3)

## 2013-06-16 LAB — PSA: PSA: 3.22 ng/mL (ref 0.10–4.00)

## 2013-06-16 LAB — LIPID PANEL: Total CHOL/HDL Ratio: 3

## 2013-06-16 LAB — TSH: TSH: 1.44 u[IU]/mL (ref 0.35–5.50)

## 2013-06-16 LAB — BASIC METABOLIC PANEL
Chloride: 104 mEq/L (ref 96–112)
Potassium: 4.4 mEq/L (ref 3.5–5.1)
Sodium: 139 mEq/L (ref 135–145)

## 2013-06-16 NOTE — Assessment & Plan Note (Signed)
BP Readings from Last 3 Encounters:  06/16/13 122/80  12/24/12 139/88  07/28/12 122/80   Good control

## 2013-06-16 NOTE — Progress Notes (Signed)
Subjective:    Patient ID: [name], male    DOB: 12-11-1955, 57 y.o.   MRN: [id number]  HPI Here for physical Saw Dr Stormy Card the plavix Still on fish oil---he thinks it helps Discussed the Co-Q 10---okay to try off and restart if myalgias (none now)  Inconsistent with exercise Weight is stable Happy now with health  Has had 2 Zenker's diverticulum procedures Now starting to collect again--he is able to mechanically expel it by pressing along neck I recommended holding off on more intervention  Notes bulge in abdominal midline Long standing No symptoms  Current Outpatient Prescriptions on File Prior to Visit  Medication Sig Dispense Refill  . aspirin 81 MG tablet Take 81 mg by mouth daily.        Marland Kitchen atorvastatin (LIPITOR) 40 MG tablet TAKE ONE TABLET BY MOUTH EVERY DAY  90 tablet  3  . cetirizine (ZYRTEC) 10 MG tablet Take 10 mg by mouth daily.        . Coenzyme Q10 (CO Q 10) 100 MG CAPS Take 1 tablet by mouth daily.        Marland Kitchen lisinopril (PRINIVIL,ZESTRIL) 20 MG tablet Take 1 tablet (20 mg total) by mouth daily.  90 tablet  3  . nitroGLYCERIN (NITROSTAT) 0.4 MG SL tablet Place 1 tablet (0.4 mg total) under the tongue every 5 (five) minutes as needed for chest pain.  25 tablet  5  . Omega-3 Fatty Acids (ULTRA OMEGA-3 FISH OIL) 1400 MG CAPS Take 1 capsule by mouth daily.         No current facility-administered medications on file prior to visit.    Allergies  Allergen Reactions  . Bee Venom     REACTION: anaphylaxis    Past Medical History  Diagnosis Date  . History of colonic polyps   . CAD (coronary artery disease)     Cypher Stent RCA and circumflex 2003( long-term Plavix and aspirin) / nuclear 2008, no ischemia, couplets and triplets in recovery  . Dyslipidemia   . HTN (hypertension)   . Allergic rhinitis   . GERD (gastroesophageal reflux disease)   . Zenker's diverticulum     Bleed and surgical repair  . Bradycardia     Relative bradycardia related  to PACs and PVCs when measuring heart rate, benign, effects ability to measure blood pressure  . Hiatal hernia   . Hemorrhoids   . Ejection fraction     EF 55-60%, echo, November, 2008  . Mitral regurgitation     Mild, echo, 2008  . Carotid artery disease     Doppler, and November, 2011, normal carotid arteries, patent vertebrals    Past Surgical History  Procedure Laterality Date  . Zenker's diverticulectomy endoscopic  08-2005    repeat in 2012  . Right shoulder  04-2009  . Left wrist  04-2009    Family History  Problem Relation Age of Onset  . Heart disease Father 61    MVA  . Hypertension Mother   . Coronary artery disease Mother   . Heart failure Mother   . Stroke Mother   . Cancer Neg Hx     no prostate or colon cancer    History   Social History  . Marital Status: Divorced    Spouse Name: N/A    Number of Children: 2  . 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 Smoking status: Former Smoker  Types: Cigarettes    Quit date: 05/27/2006  . Smokeless tobacco: Never Used  . Alcohol Use: No  . Drug Use: No  . Sexual Activity: Not on file   Other Topics Concern  . Not on file   Social History Narrative  . No narrative on file   Review of Systems  Constitutional: Negative for fatigue and unexpected weight change.       Wears seat belt  HENT: Positive for congestion, postnasal drip and rhinorrhea. Negative for dental problem, hearing loss and tinnitus.        Gets pooling with post nasal drip-- chlorpheniramine helped but knocked him out. Discussed using second non sedating or cetirizine bid  Regular with dentist---getting cavity redone  Eyes: Negative for visual disturbance.       No diplopia or unilateral vision loss  Respiratory: Positive for cough and shortness of breath. Negative for chest tightness.        Cough from post nasal drip DOE if he pushes it hard  Cardiovascular: Positive for  palpitations. Negative for chest pain and leg swelling.       Occasionally at night will awaken with heart racing--goes away on its own  Gastrointestinal: Negative for nausea, vomiting, abdominal pain, constipation and blood in stool.       Still has heartburn--uses Tums if he has dietary indiscretion. Hasn't needed omeprazole  Endocrine: Negative for cold intolerance and heat intolerance.  Genitourinary: Negative for urgency, frequency and difficulty urinating.       Rare nocturia No sexual problems  Musculoskeletal: Positive for arthralgias. Negative for back pain, joint swelling and myalgias.       Intermittent cracking in joints-- rare pain  Skin: Negative for rash.       Has small knots in various places  Allergic/Immunologic: Positive for environmental allergies. Negative for immunocompromised state.  Neurological: Positive for dizziness. Negative for syncope, weakness, light-headedness, numbness and headaches.       Will occ get sweat and dizzy just before lunch  Hematological: Negative for adenopathy. Does not bruise/bleed easily.  Psychiatric/Behavioral: Positive for sleep disturbance and dysphoric mood. The patient is not nervous/anxious.        Some sleep problems--not severe Mild mood issues at times--- no persistent depressed mood       Objective:   Physical Exam  Constitutional: He is oriented to person, place, and time. He appears well-developed and well-nourished. No distress.  HENT:  Head: Normocephalic and atraumatic.  Right Ear: External ear normal.  Left Ear: External ear normal.  Mouth/Throat: Oropharynx is clear and moist. No oropharyngeal exudate.  Eyes: Conjunctivae and EOM are normal. Pupils are equal, round, and reactive to light.  Neck: Normal range of motion. Neck supple. No thyromegaly present.  Cardiovascular: Normal rate, regular rhythm, normal heart sounds and intact distal pulses.  Exam reveals no gallop.   No murmur heard. Pulmonary/Chest: Effort  normal and breath sounds normal. No respiratory distress. He has no wheezes. He has no rales.  Abdominal: Soft. There is no tenderness.  Musculoskeletal: He exhibits no edema and no tenderness.  Lymphadenopathy:    He has no cervical adenopathy.  Neurological: He is alert and oriented to person, place, and time.  Skin: No rash noted. No erythema.  Psychiatric: He has a normal mood and affect. His behavior is normal.          Assessment & Plan:

## 2013-06-16 NOTE — Assessment & Plan Note (Signed)
No intervention for now. 

## 2013-06-16 NOTE — Assessment & Plan Note (Signed)
Generally healthy Discussed fitness Will check PSA after discussion

## 2013-06-16 NOTE — Assessment & Plan Note (Signed)
No symptoms Now off plavix

## 2013-06-16 NOTE — Assessment & Plan Note (Signed)
On statin and fish oil Due for labs

## 2013-08-27 DIAGNOSIS — IMO0002 Reserved for concepts with insufficient information to code with codable children: Secondary | ICD-10-CM

## 2013-08-27 DIAGNOSIS — R9430 Abnormal result of cardiovascular function study, unspecified: Secondary | ICD-10-CM

## 2013-08-27 HISTORY — DX: Reserved for concepts with insufficient information to code with codable children: IMO0002

## 2013-08-27 HISTORY — DX: Abnormal result of cardiovascular function study, unspecified: R94.30

## 2013-12-21 ENCOUNTER — Other Ambulatory Visit (HOSPITAL_COMMUNITY): Payer: Self-pay | Admitting: Cardiology

## 2013-12-21 DIAGNOSIS — I6529 Occlusion and stenosis of unspecified carotid artery: Secondary | ICD-10-CM

## 2013-12-29 ENCOUNTER — Ambulatory Visit: Payer: 59 | Admitting: Cardiology

## 2014-01-01 ENCOUNTER — Ambulatory Visit (INDEPENDENT_AMBULATORY_CARE_PROVIDER_SITE_OTHER): Payer: 59 | Admitting: Cardiology

## 2014-01-01 ENCOUNTER — Encounter: Payer: Self-pay | Admitting: Cardiology

## 2014-01-01 ENCOUNTER — Ambulatory Visit (HOSPITAL_COMMUNITY): Payer: 59 | Attending: Cardiovascular Disease | Admitting: Cardiology

## 2014-01-01 VITALS — BP 144/87 | HR 64 | Ht 71.0 in | Wt 186.0 lb

## 2014-01-01 DIAGNOSIS — I779 Disorder of arteries and arterioles, unspecified: Secondary | ICD-10-CM

## 2014-01-01 DIAGNOSIS — I6529 Occlusion and stenosis of unspecified carotid artery: Secondary | ICD-10-CM | POA: Insufficient documentation

## 2014-01-01 DIAGNOSIS — I25810 Atherosclerosis of coronary artery bypass graft(s) without angina pectoris: Secondary | ICD-10-CM

## 2014-01-01 DIAGNOSIS — E785 Hyperlipidemia, unspecified: Secondary | ICD-10-CM

## 2014-01-01 DIAGNOSIS — I059 Rheumatic mitral valve disease, unspecified: Secondary | ICD-10-CM

## 2014-01-01 DIAGNOSIS — I340 Nonrheumatic mitral (valve) insufficiency: Secondary | ICD-10-CM

## 2014-01-01 DIAGNOSIS — I498 Other specified cardiac arrhythmias: Secondary | ICD-10-CM

## 2014-01-01 DIAGNOSIS — R001 Bradycardia, unspecified: Secondary | ICD-10-CM

## 2014-01-01 DIAGNOSIS — I10 Essential (primary) hypertension: Secondary | ICD-10-CM

## 2014-01-01 DIAGNOSIS — I739 Peripheral vascular disease, unspecified: Secondary | ICD-10-CM

## 2014-01-01 DIAGNOSIS — I2510 Atherosclerotic heart disease of native coronary artery without angina pectoris: Secondary | ICD-10-CM

## 2014-01-01 MED ORDER — NITROGLYCERIN 0.4 MG SL SUBL: 0.4000 mg | SUBLINGUAL_TABLET | SUBLINGUAL | Status: DC | PRN

## 2014-01-01 MED ORDER — ATORVASTATIN CALCIUM 40 MG PO TABS: ORAL_TABLET | ORAL | Status: DC

## 2014-01-01 MED ORDER — LISINOPRIL 20 MG PO TABS: 20.0000 mg | ORAL_TABLET | Freq: Every day | ORAL | Status: DC

## 2014-01-01 NOTE — Assessment & Plan Note (Signed)
Coronary disease is stable. I had a careful discussion with him about the shortness of breath that he has at the top of the hill on his bike. This does not sound like ischemia. We talked about the fact that his last nuclear study was 2008. Her recommended that we proceed with exercise testing because it has been more than 5 years. He would prefer not to do this as he is feeling so well. He continues his appropriate therapy.

## 2014-01-01 NOTE — Assessment & Plan Note (Signed)
Blood pressure is treated. No change in therapy. 

## 2014-01-01 NOTE — Patient Instructions (Signed)
Your physician recommends that you continue on your current medications as directed. Please refer to the Current Medication list given to you today.  Your physician has requested that you have a carotid duplex. This test is an ultrasound of the carotid arteries in your neck. It looks at blood flow through these arteries that supply the brain with blood. Allow one hour for this exam. There are no restrictions or special instructions. To be done in 1 year.  Your physician wants you to follow-up in: 1 year. You will receive a reminder letter in the mail two months in advance. If you don't receive a letter, please call our office to schedule the follow-up appointment.

## 2014-01-01 NOTE — Assessment & Plan Note (Signed)
Lipids are being treated with guidelines or rectal therapy.

## 2014-01-01 NOTE — Assessment & Plan Note (Signed)
He is not having any significant bradycardia. No change in therapy.

## 2014-01-01 NOTE — Assessment & Plan Note (Signed)
The patient had a carotid Doppler today. He has 40-59% bilateral stenosis. This appears to be somewhat increased from the past. Follow up in one year would be fine. I had a long discussion with the patient about this. He asked me about potential therapies in the future.  As part of today's evaluation I spent greater than 25 minutes for this total care. More than half of this time is been with direct contact with the patient talking about all of his cardiac issues.

## 2014-01-01 NOTE — Assessment & Plan Note (Signed)
He had slight mitral regurgitation in the past. He does not need a followup echo.

## 2014-01-01 NOTE — Progress Notes (Signed)
Carotid completed 

## 2014-01-01 NOTE — Progress Notes (Signed)
Patient ID: [name], male   DOB: 1956-03-10, 58 y.o.   MRN: [id number]    HPI  Patient is seen for one year cardiology followup. He has known disease. He received a stent many years ago. His last stress test was 2008. He is very active. He is doing some mountain biking. He mentions that after riding up a significant he'll he does have shortness of breath. He rests and feels better. He has slight numbness in one hand with this. This is not the same as the radiating pain he had in his arms when he had his ischemic event in the past. He's very active. He had a followup carotid Doppler today. He's had some mild progression and will require followup in one year. When I saw him last year I asked him to check his blood pressure and send the data to [place] in followup. These pressures were stable. He did not have any evidence of significant hypertension. We stopped his Plavix last year. His aspirin is continued.  Allergies  Allergen Reactions  . Bee Venom     REACTION: anaphylaxis    Current Outpatient Prescriptions  Medication Sig Dispense Refill  . aspirin 81 MG tablet Take 81 mg by mouth daily.        Marland Kitchen atorvastatin (LIPITOR) 40 MG tablet TAKE ONE TABLET BY MOUTH EVERY DAY  90 tablet  3  . cetirizine (ZYRTEC) 10 MG tablet Take 10 mg by mouth daily.        . Coenzyme Q10 (CO Q 10) 100 MG CAPS Take 1 tablet by mouth daily.        Marland Kitchen lisinopril (PRINIVIL,ZESTRIL) 20 MG tablet Take 1 tablet (20 mg total) by mouth daily.  90 tablet  3  . nitroGLYCERIN (NITROSTAT) 0.4 MG SL tablet Place 1 tablet (0.4 mg total) under the tongue every 5 (five) minutes as needed for chest pain.  25 tablet  5  . Omega-3 Fatty Acids (ULTRA OMEGA-3 FISH OIL) 1400 MG CAPS Take 1 capsule by mouth daily.         No current facility-administered medications for this visit.    History   Social History  . Marital Status: Divorced    Spouse Name: N/A    Number of Children: 2  . 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 Smoking status: Former Smoker    Types: Cigarettes    Quit date: 05/27/2006  . Smokeless tobacco: Never Used  . Alcohol Use: No  . Drug Use: No  . Sexual Activity: Not on file   Other Topics Concern  . Not on file   Social History Narrative  . No narrative on file    Family History  Problem Relation Age of Onset  . Heart disease Father 4    MVA  . Hypertension Mother   . Coronary artery disease Mother   . Heart failure Mother   . Stroke Mother   . Cancer Neg Hx     no prostate or colon cancer    Past Medical History  Diagnosis Date  . History of colonic polyps   . CAD (coronary artery disease)     Cypher Stent RCA and circumflex 2003( long-term Plavix and aspirin) / nuclear 2008, no ischemia, couplets and triplets in recovery  . Dyslipidemia   . HTN (hypertension)   . Allergic rhinitis   . GERD (gastroesophageal reflux disease)   .  Zenker's diverticulum     Bleed and surgical repair  . Bradycardia     Relative bradycardia related to PACs and PVCs when measuring heart rate, benign, effects ability to measure blood pressure  . Hiatal hernia   . Hemorrhoids   . Ejection fraction     EF 55-60%, echo, November, 2008  . Mitral regurgitation     Mild, echo, 2008  . Carotid artery disease     Doppler, and November, 2011, normal carotid arteries, patent vertebrals    Past Surgical History  Procedure Laterality Date  . Zenker's diverticulectomy endoscopic  08-2005    repeat in 2012  . Right shoulder  04-2009  . Left wrist  04-2009    Patient Active Problem List   Diagnosis Date Noted  . Carotid artery disease 12/24/2012  . CAD (coronary artery disease)   . Dyslipidemia   . HTN (hypertension)   . GERD (gastroesophageal reflux disease)   . Zenker's diverticulum   . Bradycardia   . Ejection fraction   . Mitral regurgitation   . Routine general medical examination at a health care facility 12/05/2010    . HEMORRHOIDS 08/11/2010  . HIATAL HERNIA 08/11/2010  . COLONIC POLYPS, ADENOMATOUS, HX OF 08/11/2010  . ALLERGIC RHINITIS 11/28/2007    ROS   Patient denies fever, chills, headache, sweats, rash, change in vision, change in hearing, chest pain, cough, nausea or vomiting, urinary symptoms. All other systems are reviewed and are negative.  PHYSICAL EXAM  Patient is oriented to person time and place. Affect is normal. There is no jugulovenous distention. Head is atraumatic. Sclera and conjunctiva are normal. Lungs are clear. Respiratory effort is nonlabored. Cardiac exam her vitals S1 and S2. There no clicks or significant murmurs. The abdomen is soft. There is no peripheral edema. There no musculoskeletal deformities. There are no skin rashes.  Filed Vitals:   01/01/14 1519  BP: 144/87  Pulse: 64  Height: 5\' 11"  (1.803 m)  Weight: 186 lb (84.369 kg)   EKG is done today reviewed by me. His EKG is normal. There is no change from the past.  ASSESSMENT & PLAN

## 2014-01-01 NOTE — Addendum Note (Signed)
**Note De-Identified Perrion Diesel Obfuscation** Addended by: Dennie Fetters on: 01/01/2014 05:01 PM   Modules accepted: Orders

## 2014-03-11 NOTE — Telephone Encounter (Signed)
Close Encounter 

## 2014-06-18 ENCOUNTER — Encounter: Payer: Self-pay | Admitting: Internal Medicine

## 2014-06-18 ENCOUNTER — Ambulatory Visit (INDEPENDENT_AMBULATORY_CARE_PROVIDER_SITE_OTHER): Payer: 59 | Admitting: Internal Medicine

## 2014-06-18 VITALS — BP 130/80 | HR 83 | Temp 98.2°F | Ht 71.0 in | Wt 190.0 lb

## 2014-06-18 DIAGNOSIS — I10 Essential (primary) hypertension: Secondary | ICD-10-CM | POA: Diagnosis not present

## 2014-06-18 DIAGNOSIS — E785 Hyperlipidemia, unspecified: Secondary | ICD-10-CM | POA: Diagnosis not present

## 2014-06-18 DIAGNOSIS — I2510 Atherosclerotic heart disease of native coronary artery without angina pectoris: Secondary | ICD-10-CM | POA: Diagnosis not present

## 2014-06-18 DIAGNOSIS — Z0000 Encounter for general adult medical examination without abnormal findings: Secondary | ICD-10-CM

## 2014-06-18 DIAGNOSIS — Z23 Encounter for immunization: Secondary | ICD-10-CM

## 2014-06-18 LAB — COMPREHENSIVE METABOLIC PANEL
ALBUMIN: 4.1 g/dL (ref 3.5–5.2)
ALT: 34 U/L (ref 0–53)
AST: 31 U/L (ref 0–37)
Alkaline Phosphatase: 71 U/L (ref 39–117)
BILIRUBIN TOTAL: 1.1 mg/dL (ref 0.2–1.2)
BUN: 16 mg/dL (ref 6–23)
CO2: 18 mEq/L — ABNORMAL LOW (ref 19–32)
Calcium: 10.1 mg/dL (ref 8.4–10.5)
Chloride: 105 mEq/L (ref 96–112)
Creatinine, Ser: 1 mg/dL (ref 0.4–1.5)
GFR: 79.2 mL/min (ref >60.00)
GLUCOSE: 105 mg/dL — AB (ref 70–99)
POTASSIUM: 4.1 meq/L (ref 3.5–5.1)
Sodium: 138 mEq/L (ref 135–145)
TOTAL PROTEIN: 7.6 g/dL (ref 6.0–8.3)

## 2014-06-18 LAB — CBC WITH DIFFERENTIAL/PLATELET
BASOS PCT: 0.6 % (ref 0.0–3.0)
Basophils Absolute: 0 10*3/uL (ref 0.0–0.1)
Eosinophils Absolute: 0.1 10*3/uL (ref 0.0–0.7)
Eosinophils Relative: 1.5 % (ref 0.0–5.0)
HEMATOCRIT: 49.6 % (ref 39.0–52.0)
HEMOGLOBIN: 16.2 g/dL (ref 13.0–17.0)
Lymphocytes Relative: 34.4 % (ref 12.0–46.0)
Lymphs Abs: 2.9 10*3/uL (ref 0.7–4.0)
MCHC: 32.6 g/dL (ref 30.0–36.0)
MCV: 89.5 fl (ref 78.0–100.0)
MONO ABS: 0.6 10*3/uL (ref 0.1–1.0)
Monocytes Relative: 7.2 % (ref 3.0–12.0)
NEUTROS ABS: 4.7 10*3/uL (ref 1.4–7.7)
Neutrophils Relative %: 56.3 % (ref 43.0–77.0)
Platelets: 238 10*3/uL (ref 150.0–400.0)
RBC: 5.54 Mil/uL (ref 4.22–5.81)
RDW: 12.9 % (ref 11.5–15.5)
WBC: 8.3 10*3/uL (ref 4.0–10.5)

## 2014-06-18 LAB — LIPID PANEL
Cholesterol: 171 mg/dL (ref 0–200)
HDL: 47.1 mg/dL (ref >39.00)
LDL CALC: 87 mg/dL (ref 0–99)
NonHDL: 123.9
Total CHOL/HDL Ratio: 4
Triglycerides: 183 mg/dL — ABNORMAL HIGH (ref 0.0–149.0)
VLDL: 36.6 mg/dL (ref 0.0–40.0)

## 2014-06-18 LAB — T4, FREE: Free T4: 1.01 ng/dL (ref 0.60–1.60)

## 2014-06-18 NOTE — Addendum Note (Signed)
Addended by: Despina Hidden on: 06/18/2014 02:51 PM   Modules accepted: Orders

## 2014-06-18 NOTE — Progress Notes (Signed)
Pre visit review using our clinic review tool, if applicable. No additional management support is needed unless otherwise documented below in the visit note. 

## 2014-06-18 NOTE — Assessment & Plan Note (Signed)
No problems with statin 

## 2014-06-18 NOTE — Assessment & Plan Note (Signed)
Has been quiet The DOE on the bike sounds like a fitness thing--not ischemia

## 2014-06-18 NOTE — Assessment & Plan Note (Signed)
Doing well Defer PSA to next year at least UTD on colon and immunizations

## 2014-06-18 NOTE — Assessment & Plan Note (Signed)
BP Readings from Last 3 Encounters:  06/18/14 130/80  01/01/14 144/87  06/16/13 122/80   Good control No changes

## 2014-06-18 NOTE — Progress Notes (Signed)
Subjective:    Patient ID: [name], male    DOB: 06-06-1956, 58 y.o.   MRN: [id number]  HPI Here for physical Wants zostavax vaccine--insurance will cover it  Doing well Trying to exercise-- gets SOB when climbing a hill aggressively Overall fitness is slowly improving  Does get up close to morning to void Stream is less strong--harder to empty Not a real big deal in daytime  Plans to retire soon May look for some other opportunities  Current Outpatient Prescriptions on File Prior to Visit  Medication Sig Dispense Refill  . aspirin 81 MG tablet Take 81 mg by mouth daily.        Marland Kitchen atorvastatin (LIPITOR) 40 MG tablet TAKE ONE TABLET BY MOUTH EVERY DAY  90 tablet  3  . cetirizine (ZYRTEC) 10 MG tablet Take 10 mg by mouth daily.        . Coenzyme Q10 (CO Q 10) 100 MG CAPS Take 1 tablet by mouth daily.        Marland Kitchen lisinopril (PRINIVIL,ZESTRIL) 20 MG tablet Take 1 tablet (20 mg total) by mouth daily.  90 tablet  3  . nitroGLYCERIN (NITROSTAT) 0.4 MG SL tablet Place 1 tablet (0.4 mg total) under the tongue every 5 (five) minutes as needed for chest pain.  25 tablet  5  . Omega-3 Fatty Acids (ULTRA OMEGA-3 FISH OIL) 1400 MG CAPS Take 1 capsule by mouth daily.         No current facility-administered medications on file prior to visit.    Allergies  Allergen Reactions  . Bee Venom     REACTION: anaphylaxis    Past Medical History  Diagnosis Date  . History of colonic polyps   . CAD (coronary artery disease)     Cypher Stent RCA and circumflex 2003( long-term Plavix and aspirin) / nuclear 2008, no ischemia, couplets and triplets in recovery  . Dyslipidemia   . HTN (hypertension)   . Allergic rhinitis   . GERD (gastroesophageal reflux disease)   . Zenker's diverticulum     Bleed and surgical repair  . Bradycardia     Relative bradycardia related to PACs and PVCs when measuring heart rate, benign, effects ability to measure blood pressure  . Hiatal hernia   .  Hemorrhoids   . Ejection fraction     EF 55-60%, echo, November, 2008  . Mitral regurgitation     Mild, echo, 2008  . Carotid artery disease     Doppler, and November, 2011, normal carotid arteries, patent vertebrals    Past Surgical History  Procedure Laterality Date  . Zenker's diverticulectomy endoscopic  08-2005    repeat in 2012  . Right shoulder  04-2009  . Left wrist  04-2009    Family History  Problem Relation Age of Onset  . Heart disease Father 34    MVA  . Hypertension Mother   . Coronary artery disease Mother   . Heart failure Mother   . Stroke Mother   . Cancer Neg Hx     no prostate or colon cancer    History   Social History  . Marital Status: Divorced    Spouse Name: N/A    Number of Children: 2  . 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 Smoking status: Former Smoker    Types: Cigarettes    Quit date: 05/27/2006  . Smokeless tobacco: Never Used  .  Alcohol Use: No  . Drug Use: No  . Sexual Activity: Not on file   Other Topics Concern  . Not on file   Social History Narrative  . No narrative on file   Review of Systems  Constitutional: Negative for fatigue and unexpected weight change.       Wears seat belt  HENT: Negative for dental problem, hearing loss, tinnitus and trouble swallowing.        Regular with dentist  Eyes: Negative for visual disturbance.       No diplopia or unilateral vision loss  Respiratory: Negative for cough, chest tightness and shortness of breath.        Stable exertional DOE  Cardiovascular: Positive for palpitations. Negative for chest pain and leg swelling.       Occ racing sensation at night--may last for a few minutes  Gastrointestinal: Negative for nausea, vomiting, abdominal pain, constipation and blood in stool.       Occ heartburn-- tums (not common)  Endocrine: Negative for polydipsia and polyuria.  Genitourinary: Positive for difficulty  urinating. Negative for urgency and frequency.       No sexual problems  Musculoskeletal: Positive for back pain. Negative for arthralgias and joint swelling.       Rare back pain  Skin: Negative for rash.       Feels minor injuries take a long time to heal  Allergic/Immunologic: Positive for environmental allergies. Negative for immunocompromised state.  Neurological: Positive for numbness. Negative for dizziness, syncope, weakness, light-headedness and headaches.       Numbness in hands when aggressive on bike  Hematological: Negative for adenopathy. Does not bruise/bleed easily.  Psychiatric/Behavioral: Negative for sleep disturbance and dysphoric mood. The patient is not nervous/anxious.        Objective:   Physical Exam  Constitutional: He is oriented to person, place, and time. He appears well-developed and well-nourished. No distress.  HENT:  Head: Normocephalic and atraumatic.  Right Ear: External ear normal.  Left Ear: External ear normal.  Mouth/Throat: Oropharynx is clear and moist. No oropharyngeal exudate.  Eyes: Conjunctivae and EOM are normal. Pupils are equal, round, and reactive to light.  Neck: Normal range of motion. Neck supple. No thyromegaly present.  Cardiovascular: Normal rate, regular rhythm, normal heart sounds and intact distal pulses.  Exam reveals no gallop.   No murmur heard. Pulmonary/Chest: Effort normal and breath sounds normal. No respiratory distress. He has no wheezes. He has no rales.  Abdominal: Soft. There is no tenderness.  Musculoskeletal: He exhibits no edema and no tenderness.  Lymphadenopathy:    He has no cervical adenopathy.  Neurological: He is alert and oriented to person, place, and time.  Skin: No rash noted. No erythema.  Psychiatric: He has a normal mood and affect. His behavior is normal.          Assessment & Plan:

## 2014-06-22 ENCOUNTER — Telehealth: Payer: Self-pay | Admitting: Internal Medicine

## 2014-06-22 NOTE — Telephone Encounter (Signed)
emmi emailed °

## 2014-07-08 ENCOUNTER — Telehealth: Payer: Self-pay | Admitting: Cardiology

## 2014-07-08 NOTE — Telephone Encounter (Signed)
Spoke with patient and advised heart rate within normal range Patient stated that his heart rate for was around 120 after he finished his bike ride at 4:30 and took couple of hours get back under 100 This is why he started checking heart rate and blood pressure Advised patient would forward to Dr Ron Parker for recommendations  Call back if any problems before we call back  Patient verbalized understanding.

## 2014-07-08 NOTE — Telephone Encounter (Signed)
New message     Yesterday pt went for a bike ride.  At 7:10pm bp was 123/83 pulse 95; 8:10pm 131/87 pulse 88; 9:07pm 128/83 pulse 83.  This am at 4:11 149/87 pulse 82; 5:02am 136/87 pulse 86; 8:31am 156/86.  While on the phone talking to me, he took his pulse and it was 83.  Pt said his pulse is usually 60ish.  Please call

## 2014-07-09 NOTE — Telephone Encounter (Signed)
**Note De-Identified Dorethy Tomey Obfuscation** The pt called back to get recommendations from Dr Ron Parker concerning his elevated HR.  The pt is advised that Dr Ron Parker is not in the office today but that I can reach him if need be.  He states that he has been concerned with his HR readings since Wednesday 11/11 when his HR reached 95 bpm after riding his bike.  He is aware that 95 bpm is within normal limits but he states that he feels "funny" when it gets this high.  He is advised to continue to check his BP and HR once in the am at about 1 and 1/2 to 2 hours after taking his Lisinopril 20 mg and once in the evening. Also, he is advised that he may check his BP randomly once during the day and to either call, fax or send his BP and HR readings through [facility] on Wednesday 11/18.    He verbalized understanding and agrees with plan.

## 2014-07-14 ENCOUNTER — Encounter: Payer: Self-pay | Admitting: Cardiology

## 2014-07-14 NOTE — Telephone Encounter (Signed)
F/u   Pt calling back to speak to the nurse regarding the tracking of BP readings. Pt has documented readings in MyChart and wants them reviewed.

## 2014-07-14 NOTE — Telephone Encounter (Signed)
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 He is advised  that I will call him back with Dr Kae Heller recommendations. He verbalized understanding.

## 2014-07-14 NOTE — Telephone Encounter (Signed)
The pt is advised per Dr Ron Parker that he would like to see him in the office over the next few weeks to reassess his status. In the meantime, the pt is aware that Dr Ron Parker is comfortable with his exercising in a moderate fashion.  The pt scheduled an appt with Dr Ron Parker on 11/27 at 8 am.

## 2014-07-23 ENCOUNTER — Ambulatory Visit (INDEPENDENT_AMBULATORY_CARE_PROVIDER_SITE_OTHER): Payer: 59 | Admitting: Cardiology

## 2014-07-23 ENCOUNTER — Encounter: Payer: Self-pay | Admitting: Cardiology

## 2014-07-23 VITALS — BP 122/82 | HR 71 | Ht 71.0 in | Wt 191.2 lb

## 2014-07-23 DIAGNOSIS — I779 Disorder of arteries and arterioles, unspecified: Secondary | ICD-10-CM

## 2014-07-23 DIAGNOSIS — R000 Tachycardia, unspecified: Secondary | ICD-10-CM | POA: Insufficient documentation

## 2014-07-23 DIAGNOSIS — I2510 Atherosclerotic heart disease of native coronary artery without angina pectoris: Secondary | ICD-10-CM

## 2014-07-23 DIAGNOSIS — I739 Peripheral vascular disease, unspecified: Secondary | ICD-10-CM

## 2014-07-23 DIAGNOSIS — I10 Essential (primary) hypertension: Secondary | ICD-10-CM

## 2014-07-23 DIAGNOSIS — R001 Bradycardia, unspecified: Secondary | ICD-10-CM

## 2014-07-23 DIAGNOSIS — I340 Nonrheumatic mitral (valve) insufficiency: Secondary | ICD-10-CM

## 2014-07-23 DIAGNOSIS — E785 Hyperlipidemia, unspecified: Secondary | ICD-10-CM

## 2014-07-23 NOTE — Assessment & Plan Note (Signed)
Historically he's had some relative bradycardia. This was actually related to scattered PACs and PVCs when feeling his heart rate. No further workup.

## 2014-07-23 NOTE — Assessment & Plan Note (Signed)
Currently his major complaint is a sensation of increased heart rate during the recovery period after his bike ride. I'm not convinced that he is having any significant arrhythmias. This may be due to deconditioning as he has not been exercising as much. We will proceed with a stress echo to watch his heart rate response with exercise and after exercise. I will then see him back to review all of the data to see if any further evaluation is needed.  As part of today's evaluation I spent greater than 25 minutes with his total care. More than half of this time has been with a direct discussion with him in consultation about his increased heart rate and all other aspects of his cardiac care.

## 2014-07-23 NOTE — Assessment & Plan Note (Signed)
Blood pressures controlled today. No change in therapy.

## 2014-07-23 NOTE — Assessment & Plan Note (Signed)
Patient received a stent to the right coronary and circumflex in 2003. Nuclear study in 2008 revealed no ischemia. He now has noted some increased heart rate after exercising. He's had some vague chest pain and vague shortness of breath. We will proceed with a stress echo to assess him fully.

## 2014-07-23 NOTE — Progress Notes (Signed)
Patient ID: [name], male   DOB: Mar 19, 1956, 58 y.o.   MRN: [id number]    HPI Patient is seen today to follow-up coronary disease and to help assess recent increased heart rate after exercising. He rides a road bike and a [employer] bike on a regular basis. However more recently he has had decrease in his exercise frequency. This has occurred only because it has not worked for him to exercise. He then began riding his mountain bike again. He had 2 episodes where after riding he felt that his heart rate remained elevated longer than usual. He has not had any significant chest pain or shortness of breath. He is not feeling any significant palpitations at other times. He has known coronary disease and he had an intervention in 2008. When I saw him in May, 2015 we considered a stress test but decided not to proceed. Now it is time for stress testing. His last stress test was a nuclear scan in 2008.  Allergies  Allergen Reactions  . Bee Venom     REACTION: anaphylaxis    Current Outpatient Prescriptions  Medication Sig Dispense Refill  . aspirin 81 MG tablet Take 81 mg by mouth daily.      Marland Kitchen atorvastatin (LIPITOR) 40 MG tablet TAKE ONE TABLET BY MOUTH EVERY DAY 90 tablet 3  . cetirizine (ZYRTEC) 10 MG tablet Take 10 mg by mouth daily.      . Coenzyme Q10 (CO Q 10) 100 MG CAPS Take 1 tablet by mouth daily.      Marland Kitchen lisinopril (PRINIVIL,ZESTRIL) 20 MG tablet Take 1 tablet (20 mg total) by mouth daily. 90 tablet 3  . nitroGLYCERIN (NITROSTAT) 0.4 MG SL tablet Place 1 tablet (0.4 mg total) under the tongue every 5 (five) minutes as needed for chest pain. 25 tablet 5  . Omega-3 Fatty Acids (ULTRA OMEGA-3 FISH OIL) 1400 MG CAPS Take 1 capsule by mouth daily.       No current facility-administered medications for this visit.    History   Social History  . Marital Status: Divorced    Spouse Name: N/A    Number of Children: 2  . 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 Smoking status: Former Smoker    Types: Cigarettes    Quit date: 05/27/2006  . Smokeless tobacco: Never Used  . Alcohol Use: No  . Drug Use: No  . Sexual Activity: Not on file   Other Topics Concern  . Not on file   Social History Narrative    Family History  Problem Relation Age of Onset  . Heart disease Father 34    MVA  . Hypertension Mother   . Coronary artery disease Mother   . Heart failure Mother   . Stroke Mother   . Cancer Neg Hx     no prostate or colon cancer    Past Medical History  Diagnosis Date  . History of colonic polyps   . CAD (coronary artery disease)     Cypher Stent RCA and circumflex 2003( long-term Plavix and aspirin) / nuclear 2008, no ischemia, couplets and triplets in recovery  . Dyslipidemia   . HTN (hypertension)   . Allergic rhinitis   . GERD (gastroesophageal reflux disease)   . Zenker's diverticulum     Bleed and surgical repair  . Bradycardia     Relative bradycardia related to PACs and PVCs when measuring  heart rate, benign, effects ability to measure blood pressure  . Hiatal hernia   . Hemorrhoids   . Ejection fraction     EF 55-60%, echo, November, 2008  . Mitral regurgitation     Mild, echo, 2008  . Carotid artery disease     Doppler, and November, 2011, normal carotid arteries, patent vertebrals    Past Surgical History  Procedure Laterality Date  . Zenker's diverticulectomy endoscopic  08-2005    repeat in 2012  . Right shoulder  04-2009  . Left wrist  04-2009    Patient Active Problem List   Diagnosis Date Noted  . Carotid artery disease 12/24/2012  . CAD (coronary artery disease)   . Dyslipidemia   . HTN (hypertension)   . GERD (gastroesophageal reflux disease)   . Zenker's diverticulum   . Bradycardia   . Ejection fraction   . Mitral regurgitation   . Routine general medical examination at a health care facility 12/05/2010  . HEMORRHOIDS 08/11/2010  . COLONIC POLYPS,  ADENOMATOUS, HX OF 08/11/2010  . ALLERGIC RHINITIS 11/28/2007    ROS  Patient denies fever, chills, headache, sweats, rash, change in vision, change in hearing, chest pain, cough, nausea or vomiting, urinary symptoms. All other systems are reviewed and are negative.  PHYSICAL EXAM  Patient looks good today. He is oriented to person time of place. Affect is normal. Head is atraumatic. Sclerae and conjunctivae are normal. There is no jugular venous distention. Lungs are clear. Respiratory effort is nonlabored. Cardiac exam reveals S1 and S2. The abdomen is soft. There is no peripheral edema. There are no musculoskeletal deformities. There are no skin rashes.  Filed Vitals:   07/23/14 0759  BP: 122/82  Pulse: 71  Height: 5\' 11"  (1.803 m)  Weight: 191 lb 3.2 oz (86.728 kg)   EKG is done today and reviewed by me. There are no significant abnormalities. There is no change from the past.  ASSESSMENT & PLAN

## 2014-07-23 NOTE — Assessment & Plan Note (Signed)
The patient's labs are good. He is on guideline directed therapy. No change in therapy

## 2014-07-23 NOTE — Assessment & Plan Note (Signed)
The patient had a limited screening study outside of our office in April, 2014. There was question of slight right internal carotid artery narrowing. He will need a follow-up carotid Doppler in another year.

## 2014-07-23 NOTE — Assessment & Plan Note (Signed)
Historically he had mild mitral regurgitation in 2008 by echo. Standard echo will be done also to reassess his mitral valve and other valvular function.

## 2014-07-23 NOTE — Patient Instructions (Signed)
Your physician has requested that you have an echocardiogram DX CAD, SENSATION OF TACHYCARDIA, H/O MR. Echocardiography is a painless test that uses sound waves to create images of your heart. It provides your doctor with information about the size and shape of your heart and how well your heart's chambers and valves are working. This procedure takes approximately one hour. There are no restrictions for this procedure.  Your physician has requested that you have a stress echocardiogram DX CAD, SENSATION OF TACHYCARDIA, H/O MR. For further information please visit [email or web address]. Please follow instruction sheet as given.  Your physician recommends that you schedule a follow-up appointment in: 4-6 [place] DR. KATZ

## 2014-07-26 ENCOUNTER — Encounter: Payer: Self-pay | Admitting: Cardiology

## 2014-08-02 ENCOUNTER — Ambulatory Visit (HOSPITAL_BASED_OUTPATIENT_CLINIC_OR_DEPARTMENT_OTHER): Payer: 59

## 2014-08-02 ENCOUNTER — Ambulatory Visit (HOSPITAL_COMMUNITY): Payer: 59 | Attending: Internal Medicine | Admitting: Cardiology

## 2014-08-02 ENCOUNTER — Ambulatory Visit (HOSPITAL_BASED_OUTPATIENT_CLINIC_OR_DEPARTMENT_OTHER): Payer: 59 | Admitting: Cardiology

## 2014-08-02 DIAGNOSIS — R000 Tachycardia, unspecified: Secondary | ICD-10-CM

## 2014-08-02 DIAGNOSIS — I2510 Atherosclerotic heart disease of native coronary artery without angina pectoris: Secondary | ICD-10-CM

## 2014-08-02 DIAGNOSIS — R0989 Other specified symptoms and signs involving the circulatory and respiratory systems: Secondary | ICD-10-CM

## 2014-08-02 DIAGNOSIS — I340 Nonrheumatic mitral (valve) insufficiency: Secondary | ICD-10-CM

## 2014-08-02 NOTE — Progress Notes (Signed)
Stress echo attempted, images post treadmill non diagnostic.Spoke with Dr Meda Coffee, who suggested alternate imaging modality.

## 2014-08-02 NOTE — Progress Notes (Signed)
Echo performed. 

## 2014-08-10 ENCOUNTER — Encounter: Payer: Self-pay | Admitting: Cardiology

## 2014-08-12 ENCOUNTER — Encounter: Payer: Self-pay | Admitting: Cardiology

## 2014-08-12 ENCOUNTER — Ambulatory Visit (HOSPITAL_BASED_OUTPATIENT_CLINIC_OR_DEPARTMENT_OTHER): Payer: 59

## 2014-08-12 ENCOUNTER — Ambulatory Visit (HOSPITAL_COMMUNITY): Payer: 59 | Attending: Cardiology | Admitting: Radiology

## 2014-08-12 ENCOUNTER — Other Ambulatory Visit (HOSPITAL_COMMUNITY): Payer: Self-pay | Admitting: Cardiology

## 2014-08-12 DIAGNOSIS — R0600 Dyspnea, unspecified: Secondary | ICD-10-CM | POA: Insufficient documentation

## 2014-08-12 DIAGNOSIS — R000 Tachycardia, unspecified: Secondary | ICD-10-CM | POA: Diagnosis not present

## 2014-08-12 DIAGNOSIS — R002 Palpitations: Secondary | ICD-10-CM | POA: Insufficient documentation

## 2014-08-12 DIAGNOSIS — R079 Chest pain, unspecified: Secondary | ICD-10-CM | POA: Diagnosis not present

## 2014-08-12 DIAGNOSIS — R0989 Other specified symptoms and signs involving the circulatory and respiratory systems: Secondary | ICD-10-CM

## 2014-08-12 DIAGNOSIS — I2510 Atherosclerotic heart disease of native coronary artery without angina pectoris: Secondary | ICD-10-CM

## 2014-08-12 DIAGNOSIS — E785 Hyperlipidemia, unspecified: Secondary | ICD-10-CM | POA: Diagnosis not present

## 2014-08-12 DIAGNOSIS — R001 Bradycardia, unspecified: Secondary | ICD-10-CM | POA: Diagnosis not present

## 2014-08-12 DIAGNOSIS — I10 Essential (primary) hypertension: Secondary | ICD-10-CM | POA: Diagnosis not present

## 2014-08-12 NOTE — Progress Notes (Signed)
Dobutamine stress Echocardiogram performed.

## 2014-08-13 ENCOUNTER — Encounter: Payer: Self-pay | Admitting: Cardiology

## 2014-08-13 ENCOUNTER — Ambulatory Visit (INDEPENDENT_AMBULATORY_CARE_PROVIDER_SITE_OTHER): Payer: 59 | Admitting: Cardiology

## 2014-08-13 VITALS — BP 116/78 | HR 67 | Ht 71.0 in | Wt 188.7 lb

## 2014-08-13 DIAGNOSIS — I2510 Atherosclerotic heart disease of native coronary artery without angina pectoris: Secondary | ICD-10-CM

## 2014-08-13 DIAGNOSIS — R000 Tachycardia, unspecified: Secondary | ICD-10-CM

## 2014-08-13 DIAGNOSIS — I340 Nonrheumatic mitral (valve) insufficiency: Secondary | ICD-10-CM

## 2014-08-13 NOTE — Progress Notes (Signed)
Patient ID: [name], male   DOB: 1956-06-06, 58 y.o.   MRN: [id number]    HPI Patient is seen today to follow-up coronary artery disease. I saw him last in the office July 23, 2014. He has documented coronary disease. We decided to proceed with a stress echo. This was done and the patient exercised well on the treadmill. However his post stress images were inadequate for evaluation. Therefore he was not charged for this study and he came back for a dobutamine stress echo. The study was completed yesterday. I personally reviewed the images. The study is completely normal. His resting ejection fraction is 60% with no focal wall motion abnormalities. There is an excellent response to stress.  I reviewed this data with him today.  Allergies  Allergen Reactions  . Bee Venom     REACTION: anaphylaxis    Current Outpatient Prescriptions  Medication Sig Dispense Refill  . amoxicillin (AMOXIL) 500 MG capsule Take 500 mg by mouth every 6 (six) hours.     Marland Kitchen aspirin 81 MG tablet Take 81 mg by mouth daily.      Marland Kitchen atorvastatin (LIPITOR) 40 MG tablet TAKE ONE TABLET BY MOUTH EVERY DAY 90 tablet 3  . cetirizine (ZYRTEC) 10 MG tablet Take 10 mg by mouth daily.      . Coenzyme Q10 (CO Q 10) 100 MG CAPS Take 1 tablet by mouth daily.      Marland Kitchen lisinopril (PRINIVIL,ZESTRIL) 20 MG tablet Take 1 tablet (20 mg total) by mouth daily. 90 tablet 3  . nitroGLYCERIN (NITROSTAT) 0.4 MG SL tablet Place 1 tablet (0.4 mg total) under the tongue every 5 (five) minutes as needed for chest pain. 25 tablet 5  . Omega-3 Fatty Acids (ULTRA OMEGA-3 FISH OIL) 1400 MG CAPS Take 1 capsule by mouth daily.       No current facility-administered medications for this visit.    History   Social History  . Marital Status: Divorced    Spouse Name: N/A    Number of Children: 2  . 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 Smoking status: Former Smoker    Types:  Cigarettes    Quit date: 05/27/2006  . Smokeless tobacco: Never Used  . Alcohol Use: No  . Drug Use: No  . Sexual Activity: Not on file   Other Topics Concern  . Not on file   Social History Narrative    Family History  Problem Relation Age of Onset  . Heart disease Father 33    MVA  . Hypertension Mother   . Coronary artery disease Mother   . Heart failure Mother   . Stroke Mother   . Cancer Neg Hx     no prostate or colon cancer    Past Medical History  Diagnosis Date  . History of colonic polyps   . CAD (coronary artery disease)     Cypher Stent RCA and circumflex 2003( long-term Plavix and aspirin) / nuclear 2008, no ischemia, couplets and triplets in recovery  . Dyslipidemia   . HTN (hypertension)   . Allergic rhinitis   . GERD (gastroesophageal reflux disease)   . Zenker's diverticulum     Bleed and surgical repair  . Bradycardia     Relative bradycardia related to PACs and PVCs when measuring heart rate, benign, effects ability to measure blood pressure  . Hiatal hernia   . Hemorrhoids   .  Ejection fraction     EF 55-60%, echo, November, 2008  . Mitral regurgitation     Mild, echo, 2008  . Carotid artery disease     Doppler, and November, 2011, normal carotid arteries, patent vertebrals    Past Surgical History  Procedure Laterality Date  . Zenker's diverticulectomy endoscopic  08-2005    repeat in 2012  . Right shoulder  04-2009  . Left wrist  04-2009    Patient Active Problem List   Diagnosis Date Noted  . Tachycardia 07/23/2014  . Carotid artery disease 12/24/2012  . CAD (coronary artery disease)   . Hyperlipidemia   . HTN (hypertension)   . GERD (gastroesophageal reflux disease)   . Zenker's diverticulum   . Bradycardia   . Ejection fraction   . Mitral regurgitation   . Routine general medical examination at a health care facility 12/05/2010  . HEMORRHOIDS 08/11/2010  . COLONIC POLYPS, ADENOMATOUS, HX OF 08/11/2010  . ALLERGIC RHINITIS  11/28/2007    ROS  Patient denies fever, chills, headache, sweats, rash, change in vision, change in hearing, chest pain, cough, nausea or vomiting, urinary symptoms. All other systems are reviewed and are negative.  PHYSICAL EXAM Patient's oriented to person time and place. Affect is normal. Head is atraumatic. Sclerae and conjunctivae are normal. There is no jugular venous distention. Lungs are clear. Respiratory effort is not labored. Cardiac exam reveals S1 and S2. Abdomen is soft. There is no peripheral edema.  Filed Vitals:   08/13/14 0916  BP: 116/78  Pulse: 67  Height: 5\' 11"  (1.803 m)  Weight: 188 lb 11.2 oz (85.594 kg)  SpO2: 97%     ASSESSMENT & PLAN

## 2014-08-13 NOTE — Assessment & Plan Note (Signed)
Coronary disease is stable. His dobutamine stress echo reveals no abnormalities. No further workup at this time.

## 2014-08-13 NOTE — Assessment & Plan Note (Signed)
His heart rate is normal now. We do not know why he had a period of increased heart rate. However he stable now. No further workup.

## 2014-08-13 NOTE — Assessment & Plan Note (Signed)
His echo reveals that he continues to have mild mitral regurgitation. No further workup.

## 2014-08-13 NOTE — Patient Instructions (Signed)
Your physician recommends that you continue on your current medications as directed. Please refer to the Current Medication list given to you today.  Your physician wants you to follow-up in: 6 months with Dr. Ron Parker.  You will receive a reminder letter in the mail two months in advance. If you don't receive a letter, please call our office to schedule the follow-up appointment.

## 2014-12-10 ENCOUNTER — Encounter: Payer: Self-pay | Admitting: Cardiology

## 2014-12-31 ENCOUNTER — Ambulatory Visit (HOSPITAL_COMMUNITY): Payer: 59 | Attending: Cardiology

## 2014-12-31 ENCOUNTER — Encounter (HOSPITAL_COMMUNITY): Payer: 59

## 2014-12-31 DIAGNOSIS — I10 Essential (primary) hypertension: Secondary | ICD-10-CM | POA: Diagnosis not present

## 2014-12-31 DIAGNOSIS — I2510 Atherosclerotic heart disease of native coronary artery without angina pectoris: Secondary | ICD-10-CM | POA: Diagnosis not present

## 2014-12-31 DIAGNOSIS — I779 Disorder of arteries and arterioles, unspecified: Secondary | ICD-10-CM

## 2014-12-31 DIAGNOSIS — Z87891 Personal history of nicotine dependence: Secondary | ICD-10-CM | POA: Insufficient documentation

## 2014-12-31 DIAGNOSIS — E785 Hyperlipidemia, unspecified: Secondary | ICD-10-CM | POA: Diagnosis not present

## 2015-01-03 ENCOUNTER — Other Ambulatory Visit: Payer: Self-pay | Admitting: Cardiology

## 2015-01-05 ENCOUNTER — Ambulatory Visit (INDEPENDENT_AMBULATORY_CARE_PROVIDER_SITE_OTHER): Payer: 59 | Admitting: Cardiology

## 2015-01-05 ENCOUNTER — Encounter: Payer: Self-pay | Admitting: Cardiology

## 2015-01-05 VITALS — BP 132/82 | HR 66 | Ht 71.0 in | Wt 192.0 lb

## 2015-01-05 DIAGNOSIS — I779 Disorder of arteries and arterioles, unspecified: Secondary | ICD-10-CM | POA: Diagnosis not present

## 2015-01-05 DIAGNOSIS — I739 Peripheral vascular disease, unspecified: Secondary | ICD-10-CM

## 2015-01-05 DIAGNOSIS — I10 Essential (primary) hypertension: Secondary | ICD-10-CM | POA: Diagnosis not present

## 2015-01-05 DIAGNOSIS — R001 Bradycardia, unspecified: Secondary | ICD-10-CM

## 2015-01-05 DIAGNOSIS — I2510 Atherosclerotic heart disease of native coronary artery without angina pectoris: Secondary | ICD-10-CM

## 2015-01-05 DIAGNOSIS — E785 Hyperlipidemia, unspecified: Secondary | ICD-10-CM

## 2015-01-05 NOTE — Assessment & Plan Note (Signed)
Patient is receiving guideline directed therapy for his lipid abnormalities.

## 2015-01-05 NOTE — Assessment & Plan Note (Signed)
He has very mild carotid disease. He had a Doppler in May, 2016. He will need a follow-up in 2 years.

## 2015-01-05 NOTE — Progress Notes (Addendum)
Cardiology Office Note   Date:  01/05/2015   ID:  Mansur, Patti 01/20/1956, MRN [id number]  PCP:  Viviana Simpler, MD  Cardiologist:  Dola Argyle, MD   Chief Complaint  Patient presents with  . Appointment    Follow-up coronary artery disease.      History of Present Illness: [name] is a 59 y.o. male who presents today to follow-up coronary disease. He's been stable for many years. I saw him last December, 2015. In November, 2015 we decided to proceed with a stress echo. The initial images were inadequate. I brought him back for a dobutamine stress echo. There was no additional charge for this repeat procedure. The study was completely normal. There was an excellent response to stress. He has not had any significant symptoms.  The patient is aware that I am retiring. We will plan for him to see Dr. Marlou Porch in a proximally 9 months.   Past Medical History  Diagnosis Date  . History of colonic polyps   . CAD (coronary artery disease)     Cypher Stent RCA and circumflex 2003( long-term Plavix and aspirin) / nuclear 2008, no ischemia, couplets and triplets in recovery  . Dyslipidemia   . HTN (hypertension)   . Allergic rhinitis   . GERD (gastroesophageal reflux disease)   . Zenker's diverticulum     Bleed and surgical repair  . Bradycardia     Relative bradycardia related to PACs and PVCs when measuring heart rate, benign, effects ability to measure blood pressure  . Hiatal hernia   . Hemorrhoids   . Ejection fraction     EF 55-60%, echo, November, 2008  . Mitral regurgitation     Mild, echo, 2008  . Carotid artery disease     Doppler, and November, 2011, normal carotid arteries, patent vertebrals    Past Surgical History  Procedure Laterality Date  . Zenker's diverticulectomy endoscopic  08-2005    repeat in 2012  . Right shoulder  04-2009  . Left wrist  04-2009    Patient Active Problem List   Diagnosis Date Noted  . Tachycardia 07/23/2014  .  Carotid artery disease 12/24/2012  . CAD (coronary artery disease)   . Hyperlipidemia   . HTN (hypertension)   . GERD (gastroesophageal reflux disease)   . Zenker's diverticulum   . Bradycardia   . Ejection fraction   . Mitral regurgitation   . Routine general medical examination at a health care facility 12/05/2010  . HEMORRHOIDS 08/11/2010  . COLONIC POLYPS, ADENOMATOUS, HX OF 08/11/2010  . ALLERGIC RHINITIS 11/28/2007      Current Outpatient Prescriptions  Medication Sig Dispense Refill  . aspirin 81 MG tablet Take 81 mg by mouth daily.      Marland Kitchen atorvastatin (LIPITOR) 40 MG tablet TAKE ONE TABLET BY MOUTH ONCE DAILY 90 tablet 0  . cetirizine (ZYRTEC) 10 MG tablet Take 10 mg by mouth daily.      . Coenzyme Q10 (CO Q 10) 100 MG CAPS Take 1 tablet by mouth daily.      Marland Kitchen lisinopril (PRINIVIL,ZESTRIL) 20 MG tablet TAKE ONE TABLET BY MOUTH ONCE DAILY 90 tablet 0  . NITROSTAT 0.4 MG SL tablet DISSOLVE ONE TABLET UNDER THE TONGUE EVERY 5 MINUTES AS NEEDED FOR CHEST PAIN. 25 tablet 0  . Omega-3 Fatty Acids (ULTRA OMEGA-3 FISH OIL) 1400 MG CAPS Take 1 capsule by mouth daily.      . Vitamin D, Cholecalciferol, 1000 UNITS CAPS Take  1 capsule by mouth daily.     No current facility-administered medications for this visit.    Allergies:   Bee venom    Social History:  The patient  reports that he quit smoking about 8 years ago. His smoking use included Cigarettes. He has never used smokeless tobacco. He reports that he does not drink alcohol or use illicit drugs.   Family History:  The patient's family history includes Coronary artery disease in his mother; Heart disease (age of onset: 4) in his father; Heart failure in his mother; Hypertension in his mother; Stroke in his mother. There is no history of Cancer.    ROS:  Please see the history of present illness.    Patient denies fever, chills, headache, sweats, rash, change in vision, change in hearing, chest pain, cough, nausea or  vomiting, urinary symptoms. All other systems are reviewed and are negative.    PHYSICAL EXAM: VS:  BP 132/82 mmHg  Pulse 66  Ht 5\' 11"  (1.803 m)  Wt 192 lb (87.091 kg)  BMI 26.79 kg/m2  SpO2 96% , Patient is oriented to person time and place. Affect is normal. Head is atraumatic. Sclera and conjunctiva are normal. There is no jugular venous distention. Lungs are clear. Respiratory effort is nonlabored. Cardiac exam reveals S1 and S2. Abdomen is soft. There is no peripheral edema. There no musculoskeletal deformities. There are no skin rashes. Neurologic is grossly intact.  EKG:   EKG is not done today.   Recent Labs: 06/18/2014: ALT 34; BUN 16; Creatinine 1.0; Hemoglobin 16.2; Platelets 238.0; Potassium 4.1; Sodium 138    Lipid Panel    Component Value Date/Time   CHOL 171 06/18/2014 0907   TRIG 183.0* 06/18/2014 0907   HDL 47.10 06/18/2014 0907   CHOLHDL 4 06/18/2014 0907   VLDL 36.6 06/18/2014 0907   LDLCALC 87 06/18/2014 0907      Wt Readings from Last 3 Encounters:  01/05/15 192 lb (87.091 kg)  08/13/14 188 lb 11.2 oz (85.594 kg)  07/23/14 191 lb 3.2 oz (86.728 kg)      Current medicines are reviewed  The patient understands his medications.     ASSESSMENT AND PLAN:

## 2015-01-05 NOTE — Assessment & Plan Note (Signed)
Blood pressure in the office today is normal. He records his pressure at home and sometimes the systolic is above 217. He then asked me about the proper way to use his home blood pressure cuff. We had an extensive discussion about this. I showed him how to find his brachial pulse with plans to be sure that the blood pressure cuff was placed appropriately over this area. He will check pressures at home and communicate them back to me to see if we need to make any changes.

## 2015-01-05 NOTE — Patient Instructions (Signed)
Medication Instructions:  Same  Labwork: None  Testing/Procedures: None  Follow-Up: Your physician wants you to follow-up in: 9 months. You will receive a reminder letter in the mail two months in advance. If you don't receive a letter, please call our office to schedule the follow-up appointment.   Any Other Special Instructions Will Be Listed Below. Please monitor your blood pressure daily, keep list of readings then contact us Jeani Hawking at [phone] to report your readings.

## 2015-01-05 NOTE — Assessment & Plan Note (Signed)
Historically there has been concern on the patient's part about bradycardia. I have felt that this was related to PACs and PVCs in the past. He has never had documented significant bradycardia.

## 2015-01-05 NOTE — Assessment & Plan Note (Signed)
Patient received drug-eluting stent to the RCA and circumflex in 2003. Stress studies over time have not shown any significant ischemia. The most recent study was a dobutamine stress echo in December, 2015. There was no significant abnormality. No further workup.

## 2015-01-19 ENCOUNTER — Encounter: Payer: Self-pay | Admitting: Cardiology

## 2015-01-20 ENCOUNTER — Telehealth: Payer: Self-pay

## 2015-01-20 NOTE — Telephone Encounter (Addendum)
**Note De-Identified Daran Favaro Obfuscation** List is in Dr Ron Parker folder.  Will have BP list scanned into the pts chart. Please advise on below. Thanks.    [name],  I gave the list of your blood pressure readings to Dr Ron Parker for his review and he has not responded back with me yet. He is not in the office this week but I will send him a message asking if he has any recommendations for you concerning your readings. I will let you know as soon as I hear back from him. I am sorry for the delay. Thanks, Jeani Hawking  ===View-only below this line===   ----- Message -----    From: [name]    Sent: 01/19/2015  4:21 PM EDT      To: Dola Argyle, MD Subject: Visit Follow-Up Question  Follow up instructions from my visit on 01/05/15 included to monitor my blood pressure readings daily and report my readings to [place].  I hand delivered these readings to [place] on 01/16/15 but have yet to hear any response.  Do I need to report these readings again?  Thank you.

## 2015-01-21 ENCOUNTER — Encounter: Payer: Self-pay | Admitting: Cardiology

## 2015-01-21 NOTE — Telephone Encounter (Signed)
Please notify the patient that I have reviewed the blood pressure data carefully. I feel that these pressures are good and well within an acceptable range. No change in his medicine as needed. He does not need to send in any further blood pressure recordings.

## 2015-01-21 NOTE — Progress Notes (Unsigned)
After recent office visit I asked the patient record his blood pressures and pulse at home. He brought in a list that was eventually scanned into the chart. He had a pressure of 145/93 on one occasion. All other times the pressure ranged from a systolic of 789-784 and a diastolic of 78-41. These are good numbers for him. No changes needed. He does not need to check his pressure any further.  Daryel November, MD

## 2015-01-21 NOTE — Telephone Encounter (Signed)
**Note De-Identified Alisyn Lequire Obfuscation** No answer at the pts home number. I sent a message back to the pt through [facility] stating the below information as that is the way the pt presented question.

## 2015-03-31 ENCOUNTER — Other Ambulatory Visit: Payer: Self-pay | Admitting: Cardiology

## 2015-06-22 ENCOUNTER — Ambulatory Visit (INDEPENDENT_AMBULATORY_CARE_PROVIDER_SITE_OTHER): Payer: 59 | Admitting: Internal Medicine

## 2015-06-22 ENCOUNTER — Encounter: Payer: Self-pay | Admitting: Internal Medicine

## 2015-06-22 ENCOUNTER — Other Ambulatory Visit: Payer: Self-pay | Admitting: Internal Medicine

## 2015-06-22 VITALS — BP 150/88 | HR 64 | Temp 97.8°F | Ht 70.5 in | Wt 196.8 lb

## 2015-06-22 DIAGNOSIS — I10 Essential (primary) hypertension: Secondary | ICD-10-CM | POA: Diagnosis not present

## 2015-06-22 DIAGNOSIS — Z125 Encounter for screening for malignant neoplasm of prostate: Secondary | ICD-10-CM | POA: Diagnosis not present

## 2015-06-22 DIAGNOSIS — Z0000 Encounter for general adult medical examination without abnormal findings: Secondary | ICD-10-CM | POA: Diagnosis not present

## 2015-06-22 DIAGNOSIS — Z23 Encounter for immunization: Secondary | ICD-10-CM

## 2015-06-22 DIAGNOSIS — I2510 Atherosclerotic heart disease of native coronary artery without angina pectoris: Secondary | ICD-10-CM

## 2015-06-22 DIAGNOSIS — K219 Gastro-esophageal reflux disease without esophagitis: Secondary | ICD-10-CM | POA: Diagnosis not present

## 2015-06-22 LAB — CBC WITH DIFFERENTIAL/PLATELET
BASOS ABS: 0 10*3/uL (ref 0.0–0.1)
Basophils Relative: 0.4 % (ref 0.0–3.0)
EOS PCT: 2 % (ref 0.0–5.0)
Eosinophils Absolute: 0.1 10*3/uL (ref 0.0–0.7)
HCT: 48.3 % (ref 39.0–52.0)
HEMOGLOBIN: 15.9 g/dL (ref 13.0–17.0)
LYMPHS ABS: 3.2 10*3/uL (ref 0.7–4.0)
Lymphocytes Relative: 43.6 % (ref 12.0–46.0)
MCHC: 32.9 g/dL (ref 30.0–36.0)
MCV: 89.7 fl (ref 78.0–100.0)
MONOS PCT: 8.9 % (ref 3.0–12.0)
Monocytes Absolute: 0.6 10*3/uL (ref 0.1–1.0)
NEUTROS PCT: 45.1 % (ref 43.0–77.0)
Neutro Abs: 3.3 10*3/uL (ref 1.4–7.7)
Platelets: 215 10*3/uL (ref 150.0–400.0)
RBC: 5.39 Mil/uL (ref 4.22–5.81)
RDW: 12.7 % (ref 11.5–15.5)
WBC: 7.3 10*3/uL (ref 4.0–10.5)

## 2015-06-22 LAB — LIPID PANEL
Cholesterol: 155 mg/dL (ref 0–200)
HDL: 49.6 mg/dL (ref >39.00)
LDL CALC: 76 mg/dL (ref 0–99)
NonHDL: 105.83
TRIGLYCERIDES: 151 mg/dL — AB (ref 0.0–149.0)
Total CHOL/HDL Ratio: 3
VLDL: 30.2 mg/dL (ref 0.0–40.0)

## 2015-06-22 LAB — COMPREHENSIVE METABOLIC PANEL
ALBUMIN: 4.6 g/dL (ref 3.5–5.2)
ALK PHOS: 73 U/L (ref 39–117)
ALT: 40 U/L (ref 0–53)
AST: 25 U/L (ref 0–37)
BILIRUBIN TOTAL: 0.6 mg/dL (ref 0.2–1.2)
BUN: 14 mg/dL (ref 6–23)
CALCIUM: 9.9 mg/dL (ref 8.4–10.5)
CO2: 28 mEq/L (ref 19–32)
Chloride: 104 mEq/L (ref 96–112)
Creatinine, Ser: 0.9 mg/dL (ref 0.40–1.50)
GFR: 91.19 mL/min (ref >60.00)
Glucose, Bld: 107 mg/dL — ABNORMAL HIGH (ref 70–99)
POTASSIUM: 4.8 meq/L (ref 3.5–5.1)
Sodium: 140 mEq/L (ref 135–145)
TOTAL PROTEIN: 6.9 g/dL (ref 6.0–8.3)

## 2015-06-22 LAB — PSA: PSA: 1.3 ng/mL (ref 0.10–4.00)

## 2015-06-22 LAB — T4, FREE: FREE T4: 0.93 ng/dL (ref 0.60–1.60)

## 2015-06-22 MED ORDER — RANITIDINE HCL 300 MG PO TABS: 300.0000 mg | ORAL_TABLET | Freq: Every day | ORAL | Status: DC

## 2015-06-22 NOTE — Assessment & Plan Note (Signed)
Recent testing reassuring

## 2015-06-22 NOTE — Assessment & Plan Note (Signed)
BP Readings from Last 3 Encounters:  06/22/15 150/88  01/05/15 132/82  08/13/14 116/78   Usually fine No changes for now

## 2015-06-22 NOTE — Patient Instructions (Signed)
Please start the ranitidine at bedtime daily to prevent the acid symptoms.

## 2015-06-22 NOTE — Addendum Note (Signed)
Addended by: Ellamae Sia on: 06/22/2015 11:59 AM   Modules accepted: Orders

## 2015-06-22 NOTE — Assessment & Plan Note (Signed)
Seems to have frequent symptoms Will start ranitidine nightly

## 2015-06-22 NOTE — Assessment & Plan Note (Signed)
Healthy PSA after discussion Colon due 3/17 Flu vaccine today

## 2015-06-22 NOTE — Addendum Note (Signed)
Addended by: Carter Kitten on: 06/22/2015 10:39 AM   Modules accepted: Orders

## 2015-06-22 NOTE — Progress Notes (Signed)
Subjective:    Patient ID: [name], male    DOB: 10/27/1955, 59 y.o.   MRN: [id number]  HPI Here for physical Asks about hep C screening  Doing okay Did have some sensations that prompted stress testing by Dr Ron Parker All this looked okay  No other concerns Did retire in March  Current Outpatient Prescriptions on File Prior to Visit  Medication Sig Dispense Refill  . aspirin 81 MG tablet Take 81 mg by mouth daily.      Marland Kitchen atorvastatin (LIPITOR) 40 MG tablet TAKE ONE TABLET BY MOUTH ONCE DAILY 90 tablet 1  . cetirizine (ZYRTEC) 10 MG tablet Take 10 mg by mouth daily.      . Coenzyme Q10 (CO Q 10) 100 MG CAPS Take 1 tablet by mouth daily.      Marland Kitchen lisinopril (PRINIVIL,ZESTRIL) 20 MG tablet TAKE ONE TABLET BY MOUTH ONCE DAILY 90 tablet 1  . NITROSTAT 0.4 MG SL tablet DISSOLVE ONE TABLET UNDER THE TONGUE EVERY FIVE MINUTES AS NEEDED FOR CHEST PAIN 25 tablet 1  . Omega-3 Fatty Acids (ULTRA OMEGA-3 FISH OIL) 1400 MG CAPS Take 1 capsule by mouth daily.      . Vitamin D, Cholecalciferol, 1000 UNITS CAPS Take 1 capsule by mouth daily.     No current facility-administered medications on file prior to visit.    Allergies  Allergen Reactions  . Bee Venom Anaphylaxis    REACTION: anaphylaxis    Past Medical History  Diagnosis Date  . History of colonic polyps   . CAD (coronary artery disease)     Cypher Stent RCA and circumflex 2003( long-term Plavix and aspirin) / nuclear 2008, no ischemia, couplets and triplets in recovery  . Dyslipidemia   . HTN (hypertension)   . Allergic rhinitis   . GERD (gastroesophageal reflux disease)   . Zenker's diverticulum     Bleed and surgical repair  . Bradycardia     Relative bradycardia related to PACs and PVCs when measuring heart rate, benign, effects ability to measure blood pressure  . Hiatal hernia   . Hemorrhoids   . Ejection fraction     EF 55-60%, echo, November, 2008  . Mitral regurgitation     Mild, echo, 2008  . Carotid artery  disease ([place])     Doppler, and November, 2011, normal carotid arteries, patent vertebrals    Past Surgical History  Procedure Laterality Date  . Zenker's diverticulectomy endoscopic  08-2005    repeat in 2012  . Right shoulder  04-2009  . Left wrist  04-2009    Family History  Problem Relation Age of Onset  . Heart disease Father 75    MVA  . Hypertension Mother   . Coronary artery disease Mother   . Heart failure Mother   . Stroke Mother   . Cancer Neg Hx     no prostate or colon cancer    Social History   Social History  . Marital Status: Divorced    Spouse Name: N/A  . Number of Children: 2  . 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 Smoking status: Former Smoker    Types: Cigarettes    Quit date: 05/27/2006  . Smokeless tobacco: Never Used  . Alcohol Use: No  . Drug Use: No  . Sexual Activity: Not on file   Other Topics Concern  . Not on file   Social History  Narrative   Review of Systems  Constitutional: Negative for fatigue and unexpected weight change.       Wears seat belt  HENT: Negative for hearing loss, tinnitus and trouble swallowing.        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       No diplopia or unilateral vision loss  Respiratory: Positive for cough and chest tightness. Negative for shortness of breath.        Thinks the tightness is from gas--better with burping  Cardiovascular: Positive for palpitations. Negative for leg swelling.  Gastrointestinal: Negative for nausea, abdominal pain, constipation and blood in stool.  Endocrine: Negative for polydipsia and polyuria.  Genitourinary: Positive for difficulty urinating. Negative for urgency.       Slow stream and some dribbling No sexual problems  Musculoskeletal: Negative for back pain, joint swelling and arthralgias.  Skin: Negative for rash.       occ red spots just from  minor pressure--like hugging granddaughter  Allergic/Immunologic: Positive for environmental allergies. Negative for immunocompromised state.  Neurological: Positive for headaches. Negative for dizziness, syncope, weakness and light-headedness.  Hematological: Negative for adenopathy. Does not bruise/bleed easily.  Psychiatric/Behavioral: Negative for sleep disturbance and dysphoric mood. The patient is not nervous/anxious.        Objective:   Physical Exam  Constitutional: He is oriented to person, place, and time. He appears well-developed and well-nourished. No distress.  HENT:  Head: Normocephalic and atraumatic.  Right Ear: External ear normal.  Left Ear: External ear normal.  Mouth/Throat: Oropharynx is clear and moist. No oropharyngeal exudate.  Eyes: Conjunctivae and EOM are normal. Pupils are equal, round, and reactive to light.  Neck: Normal range of motion. Neck supple. No thyromegaly present.  Cardiovascular: Normal rate, regular rhythm, normal heart sounds and intact distal pulses.  Exam reveals no gallop.   No murmur heard. Pulmonary/Chest: Effort normal and breath sounds normal. No respiratory distress. He has no wheezes. He has no rales.  Abdominal: Soft. There is no tenderness.  Musculoskeletal: He exhibits no edema or tenderness.  Lymphadenopathy:    He has no cervical adenopathy.  Neurological: He is alert and oriented to person, place, and time.  Skin: No rash noted. No erythema.  Psychiatric: He has a normal mood and affect. His behavior is normal.          Assessment & Plan:

## 2015-06-22 NOTE — Progress Notes (Signed)
Pre visit review using our clinic review tool, if applicable. No additional management support is needed unless otherwise documented below in the visit note. 

## 2015-06-23 LAB — HEPATITIS C ANTIBODY: HCV AB: NEGATIVE

## 2015-10-03 ENCOUNTER — Other Ambulatory Visit: Payer: Self-pay | Admitting: *Deleted

## 2015-10-03 ENCOUNTER — Ambulatory Visit (INDEPENDENT_AMBULATORY_CARE_PROVIDER_SITE_OTHER): Payer: 59 | Admitting: Cardiology

## 2015-10-03 ENCOUNTER — Other Ambulatory Visit: Payer: Self-pay | Admitting: Cardiology

## 2015-10-03 ENCOUNTER — Encounter: Payer: Self-pay | Admitting: Cardiology

## 2015-10-03 VITALS — BP 170/80 | HR 76 | Ht 70.5 in | Wt 199.8 lb

## 2015-10-03 DIAGNOSIS — I739 Peripheral vascular disease, unspecified: Secondary | ICD-10-CM

## 2015-10-03 DIAGNOSIS — I10 Essential (primary) hypertension: Secondary | ICD-10-CM | POA: Diagnosis not present

## 2015-10-03 DIAGNOSIS — R001 Bradycardia, unspecified: Secondary | ICD-10-CM | POA: Diagnosis not present

## 2015-10-03 DIAGNOSIS — I779 Disorder of arteries and arterioles, unspecified: Secondary | ICD-10-CM | POA: Diagnosis not present

## 2015-10-03 DIAGNOSIS — I2510 Atherosclerotic heart disease of native coronary artery without angina pectoris: Secondary | ICD-10-CM

## 2015-10-03 MED ORDER — LISINOPRIL 20 MG PO TABS: 20.0000 mg | ORAL_TABLET | Freq: Every day | ORAL | Status: DC

## 2015-10-03 MED ORDER — ATORVASTATIN CALCIUM 40 MG PO TABS: 40.0000 mg | ORAL_TABLET | Freq: Every day | ORAL | Status: DC

## 2015-10-03 NOTE — Progress Notes (Signed)
Cardiology Office Note    Date:  10/03/2015   ID:  [name], [name] [birth year], MRN [id number]  PCP:  Viviana Simpler, MD  Cardiologist:   [name], MD (Former Dr. Ron Parker)    History of Present Illness:  [name] is a 60 y.o. 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 Ron Parker here for follow-up.   Taken off of beta blocker (was cutting in half).   He's had multiple blood pressure recordings for me at today's visit. Overall feels well. Previously after riding his bicycle he felt his heart was racing in the 100 range for an extended period of time. He is not felt this anymore.  Normal ejection fraction. Reassuring. No chest pain, no shortness of breath, no syncope.  He brought in his home blood pressure machine, [employer]. Every now and then he will get an air a message secondary to his PVCs.     Past Medical History  Diagnosis Date  . History of colonic polyps   . CAD (coronary artery disease)     Cypher Stent RCA and circumflex 2003( long-term Plavix and aspirin) / nuclear 2008, no ischemia, couplets and triplets in recovery  . Dyslipidemia   . HTN (hypertension)   . Allergic rhinitis   . GERD (gastroesophageal reflux disease)   . Zenker's diverticulum     Bleed and surgical repair  . Bradycardia     Relative bradycardia related to PACs and PVCs when measuring heart rate, benign, effects ability to measure blood pressure  . Hiatal hernia   . Hemorrhoids   . Ejection fraction     EF 55-60%, echo, November, 2008  . Mitral regurgitation     Mild, echo, 2008  . Carotid artery disease ([place])     Doppler, and November, 2011, normal carotid arteries, patent vertebrals    Past Surgical History  Procedure Laterality Date  . Zenker's diverticulectomy endoscopic  08-2005    repeat in 2012  . Right shoulder  04-2009  . Left wrist  04-2009     Outpatient Prescriptions Prior to Visit  Medication Sig Dispense Refill  . aspirin 81 MG tablet Take 81 mg by mouth daily.      Marland Kitchen atorvastatin (LIPITOR) 40 MG tablet TAKE ONE TABLET BY MOUTH ONCE DAILY 90 tablet 1  . cetirizine (ZYRTEC) 10 MG tablet Take 10 mg by mouth daily.      . Coenzyme Q10 (CO Q 10) 100 MG CAPS Take 1 tablet by mouth daily.      Marland Kitchen lisinopril (PRINIVIL,ZESTRIL) 20 MG tablet TAKE ONE TABLET BY MOUTH ONCE DAILY 90 tablet 1  . NITROSTAT 0.4 MG SL tablet DISSOLVE ONE TABLET UNDER THE TONGUE EVERY FIVE MINUTES AS NEEDED FOR CHEST PAIN 25 tablet 1  . Omega-3 Fatty Acids (ULTRA OMEGA-3 FISH OIL) 1400 MG CAPS Take 1 capsule by mouth daily.      . ranitidine (ZANTAC) 300 MG tablet Take 1 tablet (300 mg total) by mouth at bedtime. 90 tablet 3  . Vitamin D, Cholecalciferol, 1000 UNITS CAPS Take 1 capsule by mouth daily.     No facility-administered medications prior to visit.     Allergies:   Bee venom   Social History   Social History  . Marital Status: Divorced    Spouse Name: N/A  . Number of Children: 2  . Years of Education: N/A  Occupational History  . 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 Smoking status: Former Smoker    Types: Cigarettes    Quit date: 05/27/2006  . Smokeless tobacco: Never Used  . Alcohol Use: No  . Drug Use: No  . Sexual Activity: Not Asked   Other Topics Concern  . None   Social History Narrative     Family History:  The patient's family history includes Coronary artery disease in his mother; Heart disease (age of onset: 30) in his father; Heart failure in his mother; Hypertension in his mother; Stroke in his mother. There is no history of Cancer.   ROS:   Please see the history of present illness.    ROS All other systems reviewed and are negative.   PHYSICAL EXAM:   VS:  BP 170/80 mmHg  Pulse 76  Ht 5' 10.5" (1.791 m)  Wt 199 lb 12.8 oz (90.629 kg)  BMI 28.25 kg/m2   GEN: Well nourished,  well developed, in no acute distress HEENT: normal Neck: no JVD, carotid bruits, or masses Cardiac: RRR; no murmurs, rubs, or gallops,no edema  Respiratory:  clear to auscultation bilaterally, normal work of breathing GI: soft, nontender, nondistended, + BS MS: no deformity or atrophy Skin: warm and dry, no rash Neuro:  Alert and Oriented x 3, Strength and sensation are intact Psych: euthymic mood, full affect  Wt Readings from Last 3 Encounters:  10/03/15 199 lb 12.8 oz (90.629 kg)  06/22/15 196 lb 12 oz (89.245 kg)  01/05/15 192 lb (87.091 kg)      Studies/Labs Reviewed:   EKG:  EKG is ordered today.  The ekg ordered today demonstrates sinus rhythm, PVCs and a trigeminy pattern noted.  Recent Labs: 06/22/2015: ALT 40; BUN 14; Creatinine, Ser 0.90; Hemoglobin 15.9; Platelets 215.0; Potassium 4.8; Sodium 140   Lipid Panel    Component Value Date/Time   CHOL 155 06/22/2015 1041   TRIG 151.0* 06/22/2015 1041   HDL 49.60 06/22/2015 1041   CHOLHDL 3 06/22/2015 1041   VLDL 30.2 06/22/2015 1041   LDLCALC 76 06/22/2015 1041    Additional studies/ records that were reviewed today include:  Prior office notes reviewed, lab work, stress test    ASSESSMENT:    1. Essential hypertension   2. Bradycardia   3. Coronary artery disease involving native coronary artery of native heart without angina pectoris   4. Carotid artery disease, unspecified laterality ([place])      PLAN:  In order of problems listed above:  1. Coronary artery disease- stent to RCA and Circ (DES) 2003. NUC stress test over the years no ischemia. Most recent Dobutamine stress echo 07/2014 was reassuring.  2. Bradycardia-felt to be related to PACs/PVCs in the past, no documented significant bradycardia. 3. Carotid artery disease-very mild, Doppler May 2016. Needs a follow-up in 2018. 4. Essential hypertension-follows it at home. Most of his readings are within normal range. Occasional white coat  hypertension. 5. Hyperlipidemia-lipid. Continue with aggressive secondary prevention. Atorvastatin 40.    Medication Adjustments/Labs and Tests Ordered: Current medicines are reviewed at length with the patient today.  Concerns regarding medicines are outlined above.  Medication changes, Labs and Tests ordered today are listed in the Patient Instructions below. Patient Instructions  Medication Instructions:  The current medical regimen is effective;  continue present plan and medications.  Follow-Up: Follow up in 1 year with Dr. Marlou Porch.  You will receive a letter in the  mail 2 months before you are due.  Please call us when you receive this letter to schedule your follow up appointment.  If you need a refill on your cardiac medications before your next appointment, please call your pharmacy.  Thank you for choosing [facility]!!           Signed, [name], MD  10/03/2015 10:50 AM    [address], [place], [place]  [address] Phone: [phone]; Fax: [phone]

## 2015-10-03 NOTE — Patient Instructions (Signed)

## 2015-10-04 ENCOUNTER — Other Ambulatory Visit: Payer: Self-pay | Admitting: Cardiology

## 2015-10-04 MED ORDER — NITROGLYCERIN 0.4 MG SL SUBL: SUBLINGUAL_TABLET | SUBLINGUAL | Status: DC

## 2015-12-27 ENCOUNTER — Ambulatory Visit (AMBULATORY_SURGERY_CENTER): Payer: Self-pay | Admitting: *Deleted

## 2015-12-27 VITALS — Ht 70.0 in | Wt 197.2 lb

## 2015-12-27 DIAGNOSIS — Z86010 Personal history of colonic polyps: Secondary | ICD-10-CM

## 2015-12-27 NOTE — Progress Notes (Signed)
Patient denies any allergies to egg or soy products. Patient denies complications with anesthesia/sedation.  Patient denies oxygen use at home and denies diet medications. Emmi instructions for colonoscopy explained but patient denied.     

## 2015-12-28 ENCOUNTER — Encounter: Payer: Self-pay | Admitting: Internal Medicine

## 2016-01-09 ENCOUNTER — Ambulatory Visit (AMBULATORY_SURGERY_CENTER): Payer: 59 | Admitting: Internal Medicine

## 2016-01-09 ENCOUNTER — Encounter: Payer: Self-pay | Admitting: Internal Medicine

## 2016-01-09 VITALS — BP 106/68 | HR 78 | Temp 97.5°F | Resp 15 | Ht 70.0 in | Wt 197.0 lb

## 2016-01-09 DIAGNOSIS — Z86010 Personal history of colonic polyps: Secondary | ICD-10-CM

## 2016-01-09 DIAGNOSIS — D124 Benign neoplasm of descending colon: Secondary | ICD-10-CM | POA: Diagnosis not present

## 2016-01-09 DIAGNOSIS — D122 Benign neoplasm of ascending colon: Secondary | ICD-10-CM | POA: Diagnosis not present

## 2016-01-09 MED ORDER — SODIUM CHLORIDE 0.9 % IV SOLN: 500.0000 mL | INTRAVENOUS | Status: DC

## 2016-01-09 NOTE — Progress Notes (Signed)
Stable to RR 

## 2016-01-09 NOTE — Progress Notes (Signed)
Called to room to assist during endoscopic procedure.  Patient ID and intended procedure confirmed with present staff. Received instructions for my participation in the procedure from the performing physician.  

## 2016-01-09 NOTE — Patient Instructions (Addendum)
I found and removed 3 small polyps today. You also have a condition called diverticulosis - common and not usually a problem. Please read the handout provided.  I will let you know pathology results and when to have another routine colonoscopy by mail.  I appreciate the opportunity to care for you. Gatha Mayer, MD, FACG  YOU HAD AN ENDOSCOPIC PROCEDURE TODAY AT [facility]:   Refer to the procedure report that was given to you for any specific questions about what was found during the examination.  If the procedure report does not answer your questions, please call your gastroenterologist to clarify.  If you requested that your care partner not be given the details of your procedure findings, then the procedure report has been included in a sealed envelope for you to review at your convenience later.  YOU SHOULD EXPECT: Some feelings of bloating in the abdomen. Passage of more gas than usual.  Walking can help get rid of the air that was put into your GI tract during the procedure and reduce the bloating. If you had a lower endoscopy (such as a colonoscopy or flexible sigmoidoscopy) you may notice spotting of blood in your stool or on the toilet paper. If you underwent a bowel prep for your procedure, you may not have a normal bowel movement for a few days.  Please Note:  You might notice some irritation and congestion in your nose or some drainage.  This is from the oxygen used during your procedure.  There is no need for concern and it should clear up in a day or so.  SYMPTOMS TO REPORT IMMEDIATELY:   Following lower endoscopy (colonoscopy or flexible sigmoidoscopy):  Excessive amounts of blood in the stool  Significant tenderness or worsening of abdominal pains  Swelling of the abdomen that is new, acute  Fever of 100F or higher   For urgent or emergent issues, a gastroenterologist can be reached at any hour by calling [phone].   DIET: Your first  meal following the procedure should be a small meal and then it is ok to progress to your normal diet. Heavy or fried foods are harder to digest and may make you feel nauseous or bloated.  Likewise, meals heavy in dairy and vegetables can increase bloating.  Drink plenty of fluids but you should avoid alcoholic beverages for 24 hours.  ACTIVITY:  You should plan to take it easy for the rest of today and you should NOT DRIVE or use heavy machinery until tomorrow (because of the sedation medicines used during the test).    FOLLOW UP: Our staff will call the number listed on your records the next business day following your procedure to check on you and address any questions or concerns that you may have regarding the information given to you following your procedure. If we do not reach you, we will leave a message.  However, if you are feeling well and you are not experiencing any problems, there is no need to return our call.  We will assume that you have returned to your regular daily activities without incident.  If any biopsies were taken you will be contacted by phone or by letter within the next 1-3 weeks.  Please call us at [phone] if you have not heard about the biopsies in 3 weeks.    SIGNATURES/CONFIDENTIALITY: You and/or your care partner have signed paperwork which will be entered into your electronic medical record.  These signatures attest  to the fact that that the information above on your After Visit Summary has been reviewed and is understood.  Full responsibility of the confidentiality of this discharge information lies with you and/or your care-partner.  Polyps, diverticulosis-handout given  Repeat colonoscopy will be determined by pathology.  Resume aspirin at prior dose today.

## 2016-01-09 NOTE — Op Note (Signed)
[facility] Patient Name: [name] Procedure Date: 01/09/2016 10:04 AM MRN: [id number] Endoscopist: Gatha Mayer , MD Age: 60 Referring MD:  Date of Birth: 1956-03-27 Gender: Male Procedure:                Colonoscopy Indications:              Surveillance: Personal history of adenomatous                            polyps on last colonoscopy 5 years ago Medicines:                Propofol per Anesthesia, Monitored Anesthesia Care Procedure:                Pre-Anesthesia Assessment:                           - Prior to the procedure, a History and Physical                            was performed, and patient medications and                            allergies were reviewed. The patient's tolerance of                            previous anesthesia was also reviewed. The risks                            and benefits of the procedure and the sedation                            options and risks were discussed with the patient.                            All questions were answered, and informed consent                            was obtained. Prior Anticoagulants: The patient                            last took aspirin 1 day prior to the procedure. ASA                            Grade Assessment: II - A patient with mild systemic                            disease. After reviewing the risks and benefits,                            the patient was deemed in satisfactory condition to                            undergo the procedure.  After obtaining informed consent, the colonoscope                            was passed under direct vision. Throughout the                            procedure, the patient's blood pressure, pulse, and                            oxygen saturations were monitored continuously. The                            Model CF-HQ190L [id number]) scope was introduced                            through the anus and advanced to  the the cecum,                            identified by appendiceal orifice and ileocecal                            valve. The colonoscopy was performed without                            difficulty. The patient tolerated the procedure                            well. The bowel preparation used was Miralax. The                            quality of the bowel preparation was excellent. The                            ileocecal valve, appendiceal orifice, and rectum                            were photographed. Scope In: 10:22:16 AM Scope Out: 10:38:12 AM Scope Withdrawal Time: 0 hours 12 minutes 30 seconds  Total Procedure Duration: 0 hours 15 minutes 56 seconds  Findings:                 The perianal and digital rectal examinations were                            normal. Pertinent negatives include normal prostate                            (size, shape, and consistency).                           Three sessile polyps were found in the descending                            colon and ascending colon. The polyps were 3 to 5  mm in size. These polyps were removed with a cold                            snare. Resection and retrieval were complete.                            Verification of patient identification for the                            specimen was done. Estimated blood loss was minimal.                           Multiple diverticula were found in the sigmoid                            colon. There was no evidence of diverticular                            bleeding.                           The exam was otherwise without abnormality on                            direct and retroflexion views. Complications:            No immediate complications. Estimated Blood Loss:     Estimated blood loss was minimal. Impression:               - Three 3 to 5 mm polyps in the descending colon                            and in the ascending colon, removed with a cold                             snare. Resected and retrieved.                           - Moderate diverticulosis in the sigmoid colon.                            There was no evidence of diverticular bleeding.                           - The examination was otherwise normal on direct                            and retroflexion views. Recommendation:           - Patient has a contact number available for                            emergencies. The signs and symptoms of potential  delayed complications were discussed with the                            patient. Return to normal activities tomorrow.                            Written discharge instructions were provided to the                            patient.                           - Resume previous diet.                           - Continue present medications.                           - Resume aspirin at prior dose today.                           - Repeat colonoscopy is recommended for                            surveillance. The colonoscopy date will be                            determined after pathology results from today's                            exam become available for review.                           - he has hx adenoma 2006 and 2 in 2012 Gatha Mayer, MD 01/09/2016 10:52:31 AM This report has been signed electronically.

## 2016-01-10 ENCOUNTER — Telehealth: Payer: Self-pay | Admitting: *Deleted

## 2016-01-10 NOTE — Telephone Encounter (Signed)
  Follow up Call-  Call back number 01/09/2016  Post procedure Call Back phone  # [phone]  Permission to leave phone message Yes     Patient questions:  Do you have a fever, pain , or abdominal swelling? No. Pain Score  0 *  Have you tolerated food without any problems? Yes.    Have you been able to return to your normal activities? Yes.    Do you have any questions about your discharge instructions: Diet   No. Medications  No. Follow up visit  No.  Do you have questions or concerns about your Care? No.  Actions: * If pain score is 4 or above: No action needed, pain <4.

## 2016-01-13 ENCOUNTER — Encounter: Payer: Self-pay | Admitting: Internal Medicine

## 2016-01-13 DIAGNOSIS — Z86010 Personal history of colonic polyps: Secondary | ICD-10-CM

## 2016-01-13 NOTE — Progress Notes (Signed)
Quick Note:  2 adenomas one mucosal polyp recall 2022 ______

## 2016-06-13 ENCOUNTER — Other Ambulatory Visit: Payer: Self-pay | Admitting: Internal Medicine

## 2016-06-26 ENCOUNTER — Encounter: Payer: Self-pay | Admitting: Primary Care

## 2016-06-26 ENCOUNTER — Encounter: Payer: 59 | Admitting: Internal Medicine

## 2016-06-26 ENCOUNTER — Ambulatory Visit (INDEPENDENT_AMBULATORY_CARE_PROVIDER_SITE_OTHER): Payer: 59 | Admitting: Primary Care

## 2016-06-26 VITALS — BP 144/86 | HR 76 | Temp 97.8°F | Ht 70.0 in | Wt 191.8 lb

## 2016-06-26 DIAGNOSIS — E785 Hyperlipidemia, unspecified: Secondary | ICD-10-CM

## 2016-06-26 DIAGNOSIS — Z0000 Encounter for general adult medical examination without abnormal findings: Secondary | ICD-10-CM

## 2016-06-26 DIAGNOSIS — Z23 Encounter for immunization: Secondary | ICD-10-CM | POA: Diagnosis not present

## 2016-06-26 DIAGNOSIS — I10 Essential (primary) hypertension: Secondary | ICD-10-CM | POA: Diagnosis not present

## 2016-06-26 DIAGNOSIS — K219 Gastro-esophageal reflux disease without esophagitis: Secondary | ICD-10-CM

## 2016-06-26 DIAGNOSIS — R739 Hyperglycemia, unspecified: Secondary | ICD-10-CM

## 2016-06-26 LAB — COMPREHENSIVE METABOLIC PANEL
ALT: 33 U/L (ref 0–53)
AST: 24 U/L (ref 0–37)
Albumin: 4.4 g/dL (ref 3.5–5.2)
Alkaline Phosphatase: 62 U/L (ref 39–117)
BUN: 16 mg/dL (ref 6–23)
CHLORIDE: 106 meq/L (ref 96–112)
CO2: 27 meq/L (ref 19–32)
Calcium: 9.8 mg/dL (ref 8.4–10.5)
Creatinine, Ser: 0.86 mg/dL (ref 0.40–1.50)
GFR: 95.79 mL/min (ref >60.00)
GLUCOSE: 120 mg/dL — AB (ref 70–99)
POTASSIUM: 4.6 meq/L (ref 3.5–5.1)
Sodium: 141 mEq/L (ref 135–145)
Total Bilirubin: 0.7 mg/dL (ref 0.2–1.2)
Total Protein: 6.8 g/dL (ref 6.0–8.3)

## 2016-06-26 LAB — LIPID PANEL
CHOL/HDL RATIO: 3
Cholesterol: 132 mg/dL (ref 0–200)
HDL: 41.8 mg/dL (ref >39.00)
LDL CALC: 69 mg/dL (ref 0–99)
NONHDL: 90.05
Triglycerides: 105 mg/dL (ref 0.0–149.0)
VLDL: 21 mg/dL (ref 0.0–40.0)

## 2016-06-26 LAB — HEMOGLOBIN A1C: HEMOGLOBIN A1C: 6.1 % (ref 4.6–6.5)

## 2016-06-26 NOTE — Addendum Note (Signed)
Addended by: Jacqualin Combes on: 06/26/2016 11:31 AM   Modules accepted: Orders

## 2016-06-26 NOTE — Patient Instructions (Signed)
Complete lab work prior to leaving today. I will notify you of your results once received.   You received your tetanus and influenza vaccinations today.  It's importance to improve your diet by reducing consumption of fast food, fried food, processed snack foods, sugary drinks. Increase consumption of fresh vegetables and fruits, whole grains, water.  Ensure you are drinking 64 ounces of water daily.  Start exercising. You should be getting 150 minutes of moderate intensity exercise weekly.  I recommend an annual eye exam, please schedule this when possible.  Follow up with Dr. Silvio Pate in 1 year or sooner if typically required.  It was a pleasure meeting you!

## 2016-06-26 NOTE — Assessment & Plan Note (Signed)
Infrequent symptoms on Ranitidine

## 2016-06-26 NOTE — Progress Notes (Signed)
Pre visit review using our clinic review tool, if applicable. No additional management support is needed unless otherwise documented below in the visit note. 

## 2016-06-26 NOTE — Assessment & Plan Note (Signed)
Stable in the office today, encouraged improvements in diet and regular exercise. Continue lisinopril 20 mg.

## 2016-06-26 NOTE — Assessment & Plan Note (Signed)
Immunizations UTD, tetanus and influenza provided today. Colonoscopy and PSA UTD. Discussed the importance of a healthy diet and regular exercise in order for weight loss, and to reduce the risk of other medical diseases. Exam unremarkable. Labs pending. Follow up in 1 year for repeat physical.

## 2016-06-26 NOTE — Progress Notes (Signed)
Subjective:    Patient ID: [name], male    DOB: Jun 30, 1956, 60 y.o.   MRN: [id number]  HPI  [name] is a 60 year old male who presents today for complete physical.  Immunizations: -Tetanus: Completed in 2007. Due today. -Influenza: Due today. -Shingles: Completed in 2016  Diet: He endorses a fair diet. Breakfast: Chips and salsa, nuts, biscuit Lunch: Chips, nuts Dinner: Meat (chicken (fried, fish (baked), sandwiches Snacks: Chips, nuts Desserts: Occasionally Beverages: Water, occasional soda  Exercise: He does not exercise routinely.  Eye exam: Completed 2 years ago, due to complete.  Dental exam: Completes semi-annually Colonoscopy: Completed in 2017 PSA: Completed in 2016, normal.   Review of Systems  Constitutional: Negative for unexpected weight change.  HENT: Negative for rhinorrhea.   Respiratory: Negative for cough and shortness of breath.   Cardiovascular: Negative for chest pain.  Gastrointestinal: Negative for constipation and diarrhea.  Genitourinary: Negative for difficulty urinating.  Musculoskeletal: Negative for arthralgias and myalgias.  Skin: Negative for rash.  Allergic/Immunologic: Positive for environmental allergies.  Neurological: Negative for dizziness, numbness and headaches.  Psychiatric/Behavioral:       Denies concerns for anxiety or depression       Past Medical History:  Diagnosis Date  . Allergic rhinitis   . Allergy   . 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 CAD (coronary artery disease)    Cypher Stent RCA and circumflex 2003( long-term Plavix and aspirin) / nuclear 2008, no ischemia, couplets and triplets in recovery  . Carotid artery disease ([place])    Doppler, and November, 2011, normal carotid arteries, patent vertebrals  . COPD (chronic obstructive pulmonary disease) (H[facility])    hx smoker, no inhalers  .  Dyslipidemia   . Ejection fraction 2015   EF 55-60%, echo, stress test normal  . GERD (gastroesophageal reflux disease)   . Hemorrhoids    hx  . Hiatal hernia   . History of colonic polyps   . HTN (hypertension)   . Hyperlipidemia   . Mitral regurgitation    Mild, echo, 2008  . Zenker's diverticulum    Bleed and surgical repair     Social History   Social History  . Marital status: Divorced    Spouse name: N/A  . Number of children: 2  . Years of education: N/A   Occupational History  . 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 Smoking status: Former Smoker    Packs/day: 3.00    Years: 35.00    Types: Cigarettes    Quit date: 06/15/2006  . Smokeless tobacco: Never Used  . Alcohol use 0.0 oz/week     Comment: occasional beer  . Drug use: No  . Sexual activity: Not on file   Other Topics Concern  . Not on file   Social History Narrative  . No narrative on file    Past Surgical History:  Procedure Laterality Date  . COLONOSCOPY  2006, 2012, 2017   polyps  . LEFT WRIST  04-2009  . RIGHT SHOULDER  04-2009  . ZENKER'S DIVERTICULECTOMY ENDOSCOPIC  08-2005   repeat in 2012    Family History  Problem Relation Age of Onset  . Heart disease Father 39    MVA  . Hypertension Mother   . Coronary artery disease Mother   . Heart failure Mother   .  Stroke Mother   . Cancer Neg Hx     no prostate or colon cancer  . Colon cancer Neg Hx   . Rectal cancer Neg Hx   . Stomach cancer Neg Hx   . Esophageal cancer Neg Hx     Allergies  Allergen Reactions  . Bee Venom Anaphylaxis    REACTION: anaphylaxis    Current Outpatient Prescriptions on File Prior to Visit  Medication Sig Dispense Refill  . aspirin 81 MG tablet Take 81 mg by mouth daily.      Marland Kitchen atorvastatin (LIPITOR) 40 MG tablet Take 1 tablet (40 mg total) by mouth daily. 90 tablet 3  . cetirizine (ZYRTEC) 10 MG tablet Take 10 mg by mouth daily.      . Coenzyme Q10 (CO Q 10) 100 MG  CAPS Take 1 tablet by mouth daily.      Marland Kitchen lisinopril (PRINIVIL,ZESTRIL) 20 MG tablet Take 1 tablet (20 mg total) by mouth daily. 90 tablet 3  . Multiple Vitamin (MULTI VITAMIN DAILY PO) Take 1 tablet by mouth daily.    . nitroGLYCERIN (NITROSTAT) 0.4 MG SL tablet DISSOLVE ONE TABLET UNDER THE TONGUE EVERY FIVE MINUTES AS NEEDED FOR CHEST PAIN 25 tablet 8  . Omega-3 Fatty Acids (ULTRA OMEGA-3 FISH OIL) 1400 MG CAPS Take 1 capsule by mouth daily.      . ranitidine (ZANTAC) 300 MG tablet TAKE ONE TABLET BY MOUTH AT BEDTIME 90 tablet 3   No current facility-administered medications on file prior to visit.     BP (!) 144/86   Pulse 76   Temp 97.8 F (36.6 C) (Oral)   Ht 5\' 10"  (1.778 m)   Wt 191 lb 12.8 oz (87 kg)   SpO2 97%   BMI 27.52 kg/m    Objective:   Physical Exam  Constitutional: He is oriented to person, place, and time. He appears well-nourished.  HENT:  Right Ear: Tympanic membrane and ear canal normal.  Left Ear: Tympanic membrane and ear canal normal.  Nose: Nose normal. Right sinus exhibits no maxillary sinus tenderness and no frontal sinus tenderness. Left sinus exhibits no maxillary sinus tenderness and no frontal sinus tenderness.  Mouth/Throat: Oropharynx is clear and moist.  Eyes: Conjunctivae and EOM are normal. Pupils are equal, round, and reactive to light.  Neck: Neck supple. Carotid bruit is not present. No thyromegaly present.  Cardiovascular: Normal rate, regular rhythm and normal heart sounds.   Pulmonary/Chest: Effort normal and breath sounds normal. He has no wheezes. He has no rales.  Abdominal: Soft. Bowel sounds are normal. There is no tenderness.  Musculoskeletal: Normal range of motion.  Neurological: He is alert and oriented to person, place, and time. He has normal reflexes. No cranial nerve deficit.  Skin: Skin is warm and dry.  Psychiatric: He has a normal mood and affect.          Assessment & Plan:

## 2016-06-26 NOTE — Assessment & Plan Note (Signed)
Repeat lipids pending, continue atorvastatin 40 mg.  Denies myalgias.

## 2016-09-26 ENCOUNTER — Encounter: Payer: Self-pay | Admitting: *Deleted

## 2016-10-02 ENCOUNTER — Encounter: Payer: Self-pay | Admitting: Cardiology

## 2016-10-02 ENCOUNTER — Ambulatory Visit (INDEPENDENT_AMBULATORY_CARE_PROVIDER_SITE_OTHER): Payer: 59 | Admitting: Cardiology

## 2016-10-02 VITALS — BP 130/70 | HR 61 | Ht 70.0 in | Wt 172.8 lb

## 2016-10-02 DIAGNOSIS — I2510 Atherosclerotic heart disease of native coronary artery without angina pectoris: Secondary | ICD-10-CM | POA: Diagnosis not present

## 2016-10-02 DIAGNOSIS — I739 Peripheral vascular disease, unspecified: Secondary | ICD-10-CM

## 2016-10-02 DIAGNOSIS — I779 Disorder of arteries and arterioles, unspecified: Secondary | ICD-10-CM | POA: Diagnosis not present

## 2016-10-02 DIAGNOSIS — E7800 Pure hypercholesterolemia, unspecified: Secondary | ICD-10-CM | POA: Diagnosis not present

## 2016-10-02 DIAGNOSIS — I10 Essential (primary) hypertension: Secondary | ICD-10-CM | POA: Diagnosis not present

## 2016-10-02 MED ORDER — NITROGLYCERIN 0.4 MG SL SUBL: SUBLINGUAL_TABLET | SUBLINGUAL | 11 refills | Status: DC

## 2016-10-02 MED ORDER — ATORVASTATIN CALCIUM 40 MG PO TABS: 40.0000 mg | ORAL_TABLET | Freq: Every day | ORAL | 3 refills | Status: DC

## 2016-10-02 MED ORDER — LISINOPRIL 20 MG PO TABS: 20.0000 mg | ORAL_TABLET | Freq: Every day | ORAL | 3 refills | Status: DC

## 2016-10-02 NOTE — Progress Notes (Signed)
Cardiology Office Note    Date:  10/02/2016   ID:  Roy, Kramer 07-27-1956, MRN [id number]  PCP:  Viviana Simpler, MD  Cardiologist:   [name], MD (Former Dr. Ron Parker)    History of Present Illness:  [name] is a 61 y.o. 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 Ron Parker here for follow-up.   Taken off of beta blocker (was cutting in half).   He's had multiple blood pressure recordings for me at today's visit. Overall feels well. Previously after riding his bicycle he felt his heart was racing in the 100 range for an extended period of time. He is not felt this anymore.  Normal ejection fraction. Reassuring. No chest pain, no shortness of breath, no syncope.  He brought in his home blood pressure machine, Omron. Every now and then he will get an error a message secondary to his PVCs.  10/02/16 - chest tightness when exercising. Sat down relax. Went away. Elipical at home. 33min daily. Lost 20 pounds. Used to eat [employer]. Asymptomatic neurologically.   Past Medical History:  Diagnosis Date  . Allergic rhinitis   . Allergy   . 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 CAD (coronary artery disease)    Cypher Stent RCA and circumflex 2003( long-term Plavix and aspirin) / nuclear 2008, no ischemia, couplets and triplets in recovery  . Carotid artery disease ([place])    Doppler, and November, 2011, normal carotid arteries, patent vertebrals  . COPD (chronic obstructive pulmonary disease) (HCC)    hx smoker, no inhalers  . Dyslipidemia   . Ejection fraction 2015   EF 55-60%, echo, stress test normal  . GERD (gastroesophageal reflux disease)   . Hemorrhoids    hx  . Hiatal hernia   . History of colonic polyps   . HTN (hypertension)   .  Hyperlipidemia   . Mitral regurgitation    Mild, echo, 2008  . Zenker's diverticulum    Bleed and surgical repair    Past Surgical History:  Procedure Laterality Date  . COLONOSCOPY  2006, 2012, 2017   polyps  . LEFT WRIST  04-2009  . RIGHT SHOULDER  04-2009  . ZENKER'S DIVERTICULECTOMY ENDOSCOPIC  08-2005   repeat in 2012    Outpatient Medications Prior to Visit  Medication Sig Dispense Refill  . aspirin 81 MG tablet Take 81 mg by mouth daily.      . cetirizine (ZYRTEC) 10 MG tablet Take 10 mg by mouth daily.      . Coenzyme Q10 (CO Q 10) 100 MG CAPS Take 1 tablet by mouth daily.      . Multiple Vitamin (MULTI VITAMIN DAILY PO) Take 1 tablet by mouth daily.    . Omega-3 Fatty Acids (ULTRA OMEGA-3 FISH OIL) 1400 MG CAPS Take 1 capsule by mouth daily.      . ranitidine (ZANTAC) 300 MG tablet TAKE ONE TABLET BY MOUTH AT BEDTIME 90 tablet 3  . atorvastatin (LIPITOR) 40 MG tablet Take 1 tablet (40 mg total) by mouth daily. 90 tablet 3  . lisinopril (PRINIVIL,ZESTRIL) 20 MG tablet Take 1 tablet (20 mg total) by mouth daily. 90 tablet 3  . nitroGLYCERIN (NITROSTAT) 0.4 MG SL tablet DISSOLVE ONE TABLET UNDER THE TONGUE EVERY FIVE MINUTES AS  NEEDED FOR CHEST PAIN 25 tablet 8   No facility-administered medications prior to visit.      Allergies:   Bee venom   Social History   Social History  . Marital status: Divorced    Spouse name: N/A  . Number of children: 2  . Years of education: N/A   Occupational History  . 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 Smoking status: Former Smoker    Packs/day: 3.00    Years: 35.00    Types: Cigarettes    Quit date: 06/15/2006  . Smokeless tobacco: Never Used  . Alcohol use 0.0 oz/week     Comment: occasional beer  . Drug use: No  . Sexual activity: Not Asked   Other Topics Concern  . None   Social History Narrative  . None     Family History:  The patient's family history includes Coronary artery disease  in his mother; Heart disease (age of onset: 75) in his father; Heart failure in his mother; Hypertension in his mother; Stroke in his mother.   ROS:   Please see the history of present illness.    ROS All other systems reviewed and are negative.   PHYSICAL EXAM:   VS:  BP 130/70   Pulse 61   Ht 5\' 10"  (1.778 m)   Wt 172 lb 12.8 oz (78.4 kg)   SpO2 96%   BMI 24.79 kg/m    GEN: Well nourished, well developed, in no acute distress  HEENT: normal  Neck: no JVD, carotid bruits, or masses Cardiac: RRR; no murmurs, rubs, or gallops,no edema  Respiratory:  clear to auscultation bilaterally, normal work of breathing GI: soft, nontender, nondistended, + BS MS: no deformity or atrophy  Skin: warm and dry, no rash Neuro:  Alert and Oriented x 3, Strength and sensation are intact Psych: euthymic mood, full affect  Wt Readings from Last 3 Encounters:  10/02/16 172 lb 12.8 oz (78.4 kg)  06/26/16 191 lb 12.8 oz (87 kg)  01/09/16 197 lb (89.4 kg)      Studies/Labs Reviewed:   EKG:  EKG is ordered today.  The ekg ordered today 10/02/16-sinus bradycardia rate 53 with no other abnormalities personally viewed-prior demonstrates sinus rhythm, PVCs and a trigeminy pattern noted.  Recent Labs: 06/26/2016: ALT 33; BUN 16; Creatinine, Ser 0.86; Potassium 4.6; Sodium 141   Lipid Panel    Component Value Date/Time   CHOL 132 06/26/2016 1004   TRIG 105.0 06/26/2016 1004   HDL 41.80 06/26/2016 1004   CHOLHDL 3 06/26/2016 1004   VLDL 21.0 06/26/2016 1004   LDLCALC 69 06/26/2016 1004    Additional studies/ records that were reviewed today include:  Prior office notes reviewed, lab work, stress test    ASSESSMENT:    1. Coronary artery disease involving native coronary artery of native heart without angina pectoris   2. Essential hypertension   3. Carotid artery disease, unspecified laterality ([place])   4. Pure hypercholesterolemia      PLAN:  In order of problems listed  above:  1. Coronary artery disease- stent to RCA and Circ (DES) 2003. NUC stress test over the years no ischemia. Most recent Dobutamine stress echo 07/2014 was reassuring. Had some atypical chest discomfort after using elliptical machine, ironing. Could be musculoskeletal. If symptoms increase or become more worrisome, next step will be to perform stress test. He will let us know. 2. Bradycardia-felt to be related to PACs/PVCs  in the past, no documented significant bradycardia.Heart rate 53 on EKG. 3. Carotid artery disease-very mild, Doppler May 2016. No bruits on exam, asymptomatic. Continue with aggressive risk factor modification. 4. Essential hypertension-follows it at home. Most of his readings are within normal range. Occasional white coat hypertension. Weight loss has helped. 5. Hyperlipidemia-lipid. Continue with aggressive secondary prevention. Atorvastatin 40. He is taking fish oil as well as co-every 10.    Medication Adjustments/Labs and Tests Ordered: Current medicines are reviewed at length with the patient today.  Concerns regarding medicines are outlined above.  Medication changes, Labs and Tests ordered today are listed in the Patient Instructions below. Patient Instructions  Medication Instructions:  The current medical regimen is effective;  continue present plan and medications.  Follow-Up: Follow up in 1 year with Dr. Marlou Porch.  You will receive a letter in the mail 2 months before you are due.  Please call us when you receive this letter to schedule your follow up appointment.  If you need a refill on your cardiac medications before your next appointment, please call your pharmacy.  Thank you for choosing [facility]!!          Signed, [name], MD  10/02/2016 8:56 AM    [facility] [place], [place], [place]  [address] Phone: [phone]; Fax: [phone]

## 2016-10-02 NOTE — Patient Instructions (Signed)

## 2016-11-06 DIAGNOSIS — H524 Presbyopia: Secondary | ICD-10-CM | POA: Diagnosis not present

## 2016-12-17 ENCOUNTER — Encounter: Payer: Self-pay | Admitting: Internal Medicine

## 2016-12-17 DIAGNOSIS — R7303 Prediabetes: Secondary | ICD-10-CM

## 2016-12-17 NOTE — Telephone Encounter (Signed)
Please set up a time for him to come in for these blood tests

## 2016-12-18 ENCOUNTER — Encounter: Payer: Self-pay | Admitting: Internal Medicine

## 2016-12-18 ENCOUNTER — Other Ambulatory Visit (INDEPENDENT_AMBULATORY_CARE_PROVIDER_SITE_OTHER): Payer: 59

## 2016-12-18 DIAGNOSIS — R7303 Prediabetes: Secondary | ICD-10-CM

## 2016-12-18 LAB — HEMOGLOBIN A1C: Hgb A1c MFr Bld: 6 % (ref 4.6–6.5)

## 2016-12-18 LAB — GLUCOSE, RANDOM: GLUCOSE: 113 mg/dL — AB (ref 70–99)

## 2017-06-12 ENCOUNTER — Other Ambulatory Visit: Payer: Self-pay | Admitting: Internal Medicine

## 2017-06-28 ENCOUNTER — Encounter: Payer: Self-pay | Admitting: Internal Medicine

## 2017-06-28 ENCOUNTER — Ambulatory Visit (INDEPENDENT_AMBULATORY_CARE_PROVIDER_SITE_OTHER): Payer: 59 | Admitting: Internal Medicine

## 2017-06-28 VITALS — BP 116/70 | HR 68 | Temp 97.8°F | Ht 70.5 in | Wt 174.0 lb

## 2017-06-28 DIAGNOSIS — Z0000 Encounter for general adult medical examination without abnormal findings: Secondary | ICD-10-CM

## 2017-06-28 DIAGNOSIS — I10 Essential (primary) hypertension: Secondary | ICD-10-CM | POA: Diagnosis not present

## 2017-06-28 DIAGNOSIS — N401 Enlarged prostate with lower urinary tract symptoms: Secondary | ICD-10-CM

## 2017-06-28 DIAGNOSIS — Z23 Encounter for immunization: Secondary | ICD-10-CM

## 2017-06-28 DIAGNOSIS — I2510 Atherosclerotic heart disease of native coronary artery without angina pectoris: Secondary | ICD-10-CM | POA: Diagnosis not present

## 2017-06-28 DIAGNOSIS — R7301 Impaired fasting glucose: Secondary | ICD-10-CM | POA: Diagnosis not present

## 2017-06-28 DIAGNOSIS — N138 Other obstructive and reflux uropathy: Secondary | ICD-10-CM

## 2017-06-28 LAB — COMPREHENSIVE METABOLIC PANEL
ALT: 23 U/L (ref 0–53)
AST: 20 U/L (ref 0–37)
Albumin: 4.4 g/dL (ref 3.5–5.2)
Alkaline Phosphatase: 64 U/L (ref 39–117)
BILIRUBIN TOTAL: 0.7 mg/dL (ref 0.2–1.2)
BUN: 20 mg/dL (ref 6–23)
CHLORIDE: 105 meq/L (ref 96–112)
CO2: 27 meq/L (ref 19–32)
Calcium: 9.8 mg/dL (ref 8.4–10.5)
Creatinine, Ser: 0.81 mg/dL (ref 0.40–1.50)
GFR: 102.31 mL/min (ref >60.00)
GLUCOSE: 114 mg/dL — AB (ref 70–99)
Potassium: 4.6 mEq/L (ref 3.5–5.1)
Sodium: 140 mEq/L (ref 135–145)
Total Protein: 6.5 g/dL (ref 6.0–8.3)

## 2017-06-28 LAB — LIPID PANEL
CHOL/HDL RATIO: 3
Cholesterol: 149 mg/dL (ref 0–200)
HDL: 50.3 mg/dL (ref >39.00)
LDL CALC: 74 mg/dL (ref 0–99)
NONHDL: 99.06
Triglycerides: 125 mg/dL (ref 0.0–149.0)
VLDL: 25 mg/dL (ref 0.0–40.0)

## 2017-06-28 LAB — HEMOGLOBIN A1C: Hgb A1c MFr Bld: 6.1 % (ref 4.6–6.5)

## 2017-06-28 LAB — CBC
HCT: 47.9 % (ref 39.0–52.0)
Hemoglobin: 15.8 g/dL (ref 13.0–17.0)
MCHC: 33 g/dL (ref 30.0–36.0)
MCV: 91.8 fl (ref 78.0–100.0)
Platelets: 205 10*3/uL (ref 150.0–400.0)
RBC: 5.22 Mil/uL (ref 4.22–5.81)
RDW: 13.3 % (ref 11.5–15.5)
WBC: 6.8 10*3/uL (ref 4.0–10.5)

## 2017-06-28 LAB — PSA: PSA: 1.84 ng/mL (ref 0.10–4.00)

## 2017-06-28 MED ORDER — RANITIDINE HCL 300 MG PO TABS: 300.0000 mg | ORAL_TABLET | Freq: Every day | ORAL | 3 refills | Status: DC

## 2017-06-28 NOTE — Addendum Note (Signed)
Addended by: Pilar Grammes on: 06/28/2017 11:51 AM   Modules accepted: Orders

## 2017-06-28 NOTE — Progress Notes (Signed)
Subjective:    Patient ID: [name], male    DOB: 12-12-1955, 61 y.o.   MRN: [id number]  HPI Here for physical  Working on fitness Still concerned about diabetes risk Has lost more weight Wondering about the increased risk with statin (saw report that pravastatin reduces risk)  Slight scratchy throat Spit out some blood yesterday morning--seems to have been from nose No SOB No fever  No chest pain No palpitations No further tightness and burping with the ranitidine  Current Outpatient Prescriptions on File Prior to Visit  Medication Sig Dispense Refill  . aspirin 81 MG tablet Take 81 mg by mouth daily.      Marland Kitchen atorvastatin (LIPITOR) 40 MG tablet Take 1 tablet (40 mg total) by mouth daily. 90 tablet 3  . cetirizine (ZYRTEC) 10 MG tablet Take 10 mg by mouth daily.      . Coenzyme Q10 (CO Q 10) 100 MG CAPS Take 1 tablet by mouth daily.      Marland Kitchen lisinopril (PRINIVIL,ZESTRIL) 20 MG tablet Take 1 tablet (20 mg total) by mouth daily. 90 tablet 3  . Multiple Vitamin (MULTI VITAMIN DAILY PO) Take 1 tablet by mouth daily.    . nitroGLYCERIN (NITROSTAT) 0.4 MG SL tablet DISSOLVE ONE TABLET UNDER THE TONGUE EVERY FIVE MINUTES AS NEEDED FOR CHEST PAIN 25 tablet 11  . Omega-3 Fatty Acids (ULTRA OMEGA-3 FISH OIL) 1400 MG CAPS Take 1 capsule by mouth daily.      . ranitidine (ZANTAC) 300 MG tablet TAKE ONE TABLET BY MOUTH AT BEDTIME 90 tablet 0   No current facility-administered medications on file prior to visit.     Allergies  Allergen Reactions  . Bee Venom Anaphylaxis    REACTION: anaphylaxis    Past Medical History:  Diagnosis Date  . Allergic rhinitis   . Allergy   . 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 CAD (coronary artery disease)    Cypher Stent RCA and circumflex 2003( long-term Plavix and aspirin) / nuclear 2008, no ischemia, couplets and triplets in recovery  .  Carotid artery disease ([place])    Doppler, and November, 2011, normal carotid arteries, patent vertebrals  . COPD (chronic obstructive pulmonary disease) (HCC)    hx smoker, no inhalers  . Dyslipidemia   . Ejection fraction 2015   EF 55-60%, echo, stress test normal  . GERD (gastroesophageal reflux disease)   . Hemorrhoids    hx  . Hiatal hernia   . History of colonic polyps   . HTN (hypertension)   . Hyperlipidemia   . Mitral regurgitation    Mild, echo, 2008  . Zenker's diverticulum    Bleed and surgical repair    Past Surgical History:  Procedure Laterality Date  . COLONOSCOPY  2006, 2012, 2017   polyps  . LEFT WRIST  04-2009  . RIGHT SHOULDER  04-2009  . ZENKER'S DIVERTICULECTOMY ENDOSCOPIC  08-2005   repeat in 2012    Family History  Problem Relation Age of Onset  . Heart disease Father 71       MVA  . Hypertension Mother   . Coronary artery disease Mother   . Heart failure Mother   . Stroke Mother   . Cancer Neg Hx        no prostate or colon cancer  . Colon cancer Neg Hx   . Rectal cancer Neg Hx   .  Stomach cancer Neg Hx   . Esophageal cancer Neg Hx     Social History   Social History  . Marital status: Divorced    Spouse name: N/A  . Number of children: 2  . Years of education: N/A   Occupational History  . 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 Smoking status: Former Smoker    Packs/day: 3.00    Years: 35.00    Types: Cigarettes    Quit date: 06/15/2006  . Smokeless tobacco: Never Used  . Alcohol use 0.0 oz/week     Comment: occasional beer  . Drug use: No  . Sexual activity: Not on file   Other Topics Concern  . Not on file   Social History Narrative  . No narrative on file   Review of Systems  Constitutional: Negative for fatigue.       Has lost weight Wears seat belt  HENT: Negative for dental problem, hearing loss, tinnitus and trouble swallowing.        Keeps up with dentist  Eyes: Negative for visual  disturbance.       No diplopia or unilateral vision loss  Respiratory: Negative for cough, chest tightness and shortness of breath.   Cardiovascular: Negative for chest pain, palpitations and leg swelling.  Gastrointestinal: Negative for blood in stool and constipation.  Endocrine: Negative for polydipsia and polyuria.  Genitourinary: Positive for difficulty urinating.       Slow stream, some dribbling Nocturia x 2 usually--rarely up to 4 No sexual problems   Musculoskeletal: Negative for back pain and joint swelling.       Rare mild hip pain  Skin: Negative for rash.       Did see derm once--nothing of worry  Allergic/Immunologic: Positive for environmental allergies. Negative for immunocompromised state.  Neurological: Negative for dizziness, syncope and light-headedness.       Occ mild headache  Hematological: Negative for adenopathy. Does not bruise/bleed easily.  Psychiatric/Behavioral: Negative for dysphoric mood and sleep disturbance. The patient is not nervous/anxious.        Objective:   Physical Exam  Constitutional: He is oriented to person, place, and time. He appears well-developed. No distress.  HENT:  Head: Normocephalic and atraumatic.  Right Ear: External ear normal.  Left Ear: External ear normal.  Mouth/Throat: Oropharynx is clear and moist.  Eyes: Pupils are equal, round, and reactive to light. Conjunctivae are normal.  Neck: Normal range of motion. No thyromegaly present.  Cardiovascular: Normal rate, regular rhythm, normal heart sounds and intact distal pulses.  Exam reveals no gallop.   No murmur heard. Pulmonary/Chest: Effort normal and breath sounds normal. No respiratory distress. He has no wheezes. He has no rales.  Abdominal: Soft. He exhibits no distension. There is no tenderness. There is no rebound and no guarding.  Musculoskeletal: He exhibits no edema or tenderness.  Lymphadenopathy:    He has no cervical adenopathy.  Neurological: He is alert  and oriented to person, place, and time.  Skin: No rash noted. No erythema.  Psychiatric: He has a normal mood and affect. His behavior is normal.          Assessment & Plan:

## 2017-06-28 NOTE — Assessment & Plan Note (Signed)
BP Readings from Last 3 Encounters:  06/28/17 116/70  10/02/16 130/70  06/26/16 (!) 144/86

## 2017-06-28 NOTE — Assessment & Plan Note (Signed)
Quiet now On appropriate regimen

## 2017-06-28 NOTE — Assessment & Plan Note (Signed)
Healthy Working on fitness Will check PSA after discussion Colon due 2022

## 2017-06-28 NOTE — Assessment & Plan Note (Signed)
Hopefully better now

## 2017-06-28 NOTE — Assessment & Plan Note (Signed)
Not ready for meds yet

## 2017-09-10 ENCOUNTER — Encounter: Payer: Self-pay | Admitting: Internal Medicine

## 2017-10-03 ENCOUNTER — Encounter: Payer: Self-pay | Admitting: Cardiology

## 2017-10-03 ENCOUNTER — Ambulatory Visit: Payer: 59 | Admitting: Cardiology

## 2017-10-03 VITALS — BP 130/82 | HR 63 | Ht 70.5 in | Wt 178.6 lb

## 2017-10-03 DIAGNOSIS — I10 Essential (primary) hypertension: Secondary | ICD-10-CM | POA: Diagnosis not present

## 2017-10-03 DIAGNOSIS — I2510 Atherosclerotic heart disease of native coronary artery without angina pectoris: Secondary | ICD-10-CM

## 2017-10-03 DIAGNOSIS — E7800 Pure hypercholesterolemia, unspecified: Secondary | ICD-10-CM

## 2017-10-03 DIAGNOSIS — I779 Disorder of arteries and arterioles, unspecified: Secondary | ICD-10-CM

## 2017-10-03 DIAGNOSIS — I739 Peripheral vascular disease, unspecified: Secondary | ICD-10-CM

## 2017-10-03 DIAGNOSIS — I493 Ventricular premature depolarization: Secondary | ICD-10-CM

## 2017-10-03 NOTE — Patient Instructions (Signed)

## 2017-10-03 NOTE — Progress Notes (Signed)
Cardiology Office Note    Date:  10/03/2017   ID:  Jones, Viviani 12/05/1955, MRN [id number]  PCP:  [name], MD  Cardiologist:   [name], MD (Former Dr. Ron Parker)    History of Present Illness:  [name] is a 62 y.o. 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 Ron Parker here for follow-up.   Taken off of beta blocker (was cutting in half).   He's had multiple blood pressure recordings. Overall feels well. Previously after riding his bicycle he felt his heart was racing in the 100 range for an extended period of time. He is not felt this anymore.  Normal ejection fraction. Reassuring.   He brought in his home blood pressure machine, Omron. Every now and then he will get an error a message secondary to his PVCs.  10/02/16 - chest tightness when exercising. Sat down relax. Went away. Elipical at home. 75min daily. Lost 20 pounds. Used to eat [employer]. Asymptomatic neurologically.  10/03/17 - periods of days when pulse has been 35-37. Checks it on phone, BP cuff there, manual there as well. 2 days or so.  Question about sugar increase with atorvastatin.  No further chest pain episodes.  He is exercising quite well.  [place] mountain bike on the [place] in [place].  Has a stump jumper.  Carbon bike.  No syncope bleeding orthopnea PND   Past Medical History:  Diagnosis Date  . Allergic rhinitis   . Allergy   . 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 CAD (coronary artery disease)    Cypher Stent RCA and circumflex 2003( long-term Plavix and aspirin) / nuclear 2008, no ischemia, couplets and triplets in recovery  . Carotid artery disease ([place])    Doppler, and November, 2011, normal carotid arteries, patent vertebrals  . COPD  (chronic obstructive pulmonary disease) (HCC)    hx smoker, no inhalers  . Dyslipidemia   . Ejection fraction 2015   EF 55-60%, echo, stress test normal  . GERD (gastroesophageal reflux disease)   . Hemorrhoids    hx  . Hiatal hernia   . History of colonic polyps   . HTN (hypertension)   . Hyperlipidemia   . Mitral regurgitation    Mild, echo, 2008  . Zenker's diverticulum    Bleed and surgical repair    Past Surgical History:  Procedure Laterality Date  . COLONOSCOPY  2006, 2012, 2017   polyps  . LEFT WRIST  04-2009  . RIGHT SHOULDER  04-2009  . ZENKER'S DIVERTICULECTOMY ENDOSCOPIC  08-2005   repeat in 2012    Outpatient Medications Prior to Visit  Medication Sig Dispense Refill  . aspirin 81 MG tablet Take 81 mg by mouth daily.      Marland Kitchen atorvastatin (LIPITOR) 40 MG tablet Take 1 tablet (40 mg total) by mouth daily. 90 tablet 3  . cetirizine (ZYRTEC) 10 MG tablet Take 10 mg by mouth daily.      . Coenzyme Q10 (CO Q 10) 100 MG CAPS Take 1 tablet by mouth daily.      Marland Kitchen lisinopril (PRINIVIL,ZESTRIL) 20 MG tablet Take 1 tablet (20 mg total) by mouth daily. 90 tablet 3  . Multiple Vitamin (MULTI VITAMIN DAILY PO) Take 1 tablet by mouth daily.    Marland Kitchen  nitroGLYCERIN (NITROSTAT) 0.4 MG SL tablet DISSOLVE ONE TABLET UNDER THE TONGUE EVERY FIVE MINUTES AS NEEDED FOR CHEST PAIN 25 tablet 11  . Omega-3 Fatty Acids (ULTRA OMEGA-3 FISH OIL) 1400 MG CAPS Take 1 capsule by mouth daily.      . ranitidine (ZANTAC) 300 MG tablet Take 1 tablet (300 mg total) by mouth at bedtime. 90 tablet 3   No facility-administered medications prior to visit.      Allergies:   Bee venom   Social History   Socioeconomic History  . Marital status: Divorced    Spouse name: None  . Number of children: 2  . Years of education: None  . Highest education level: None  Social Needs  . Financial resource strain: None  . Food insecurity - worry: None  . Food insecurity - inability: None  . Transportation needs -  medical: None  . Transportation needs - non-medical: None  Occupational History  . 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 Smoking status: Former Smoker    Packs/day: 3.00    Years: 35.00    Pack years: 105.00    Types: Cigarettes    Last attempt to quit: 06/15/2006    Years since quitting: 11.3  . Smokeless tobacco: Never Used  Substance and Sexual Activity  . Alcohol use: Yes    Alcohol/week: 0.0 oz    Comment: occasional beer  . Drug use: No  . Sexual activity: None  Other Topics Concern  . None  Social History Narrative  . None     Family History:  The patient's family history includes Coronary artery disease in his mother; Heart disease in his brother; Heart disease (age of onset: 40) in his father; Heart failure in his mother; Hypertension in his mother; Stroke in his mother.   ROS:   Please see the history of present illness.    Review of Systems  All other systems reviewed and are negative.     PHYSICAL EXAM:   VS:  BP 130/82   Pulse 63   Ht 5' 10.5" (1.791 m)   Wt 178 lb 9.6 oz (81 kg)   SpO2 95%   BMI 25.26 kg/m    GEN: Well nourished, well developed, in no acute distress  HEENT: normal  Neck: no JVD, carotid bruits, or masses Cardiac: RRR; no murmurs, rubs, or gallops,no edema  Respiratory:  clear to auscultation bilaterally, normal work of breathing GI: soft, nontender, nondistended, + BS MS: no deformity or atrophy  Skin: warm and dry, no rash Neuro:  Alert and Oriented x 3, Strength and sensation are intact Psych: euthymic mood, full affect   Wt Readings from Last 3 Encounters:  10/03/17 178 lb 9.6 oz (81 kg)  06/28/17 174 lb (78.9 kg)  10/02/16 172 lb 12.8 oz (78.4 kg)      Studies/Labs Reviewed:   EKG:  EKG is ordered today.  The ekg ordered today 10/02/17 - sinus bradycardia 56 with no other abnormalities personally viewed-prior 10/02/16-sinus bradycardia rate 53 with no other abnormalities personally  viewed-prior demonstrates sinus rhythm, PVCs and a trigeminy pattern noted.  Recent Labs: 06/28/2017: ALT 23; BUN 20; Creatinine, Ser 0.81; Hemoglobin 15.8; Platelets 205.0; Potassium 4.6; Sodium 140   Lipid Panel    Component Value Date/Time   CHOL 149 06/28/2017 0922   TRIG 125.0 06/28/2017 0922   HDL 50.30 06/28/2017 0922   CHOLHDL 3 06/28/2017 0922   VLDL 25.0 06/28/2017 7017  [place] 74 06/28/2017 0922    Additional studies/ records that were reviewed today include:  Prior office notes reviewed, lab work, stress test    ASSESSMENT:    1. Coronary artery disease involving native coronary artery of native heart without angina pectoris   2. Essential hypertension   3. Carotid artery disease, unspecified laterality ([employer])   4. Pure hypercholesterolemia      PLAN:  In order of problems listed above:  1. Coronary artery disease- stent to RCA and Circ (DES) 2003. NUC stress test over the years no ischemia. Most recent Dobutamine stress echo 07/2014 was reassuring.  No further chest discomfort. 2. Bradycardia-felt to be related to PACs/PVCs in the past, no documented significant bradycardia.Heart rate 53 on EKG. once again relayed that this is likely secondary to ventricular bigeminy. 3. Carotid artery disease-very mild, Doppler May 2016. No bruits on exam, asymptomatic. Continue with aggressive risk factor modification.  No changes. 4. Essential hypertension-follows it at home. Most of his readings are within normal range. Occasional white coat hypertension. Weight loss has helped.  He is doing quite well.  Stable. 5. Hyperlipidemia-lipid. Continue with aggressive secondary prevention. Atorvastatin 40. He is taking fish oil as well as co-every 10.  Let us continue with the atorvastatin.  I am willing to tolerate a subtle increase in blood glucose.  Benefits outweigh risks.  Hemoglobin A1c 6.1.    Medication Adjustments/Labs and Tests Ordered: Current medicines are reviewed at  length with the patient today.  Concerns regarding medicines are outlined above.  Medication changes, Labs and Tests ordered today are listed in the Patient Instructions below. Patient Instructions  Medication Instructions:  The current medical regimen is effective;  continue present plan and medications.  Follow-Up: Follow up in 1 year with Dr. Marlou Porch.  You will receive a letter in the mail 2 months before you are due.  Please call us when you receive this letter to schedule your follow up appointment.  If you need a refill on your cardiac medications before your next appointment, please call your pharmacy.  Thank you for choosing [facility]!!          Signed, [name], MD  10/03/2017 10:03 AM    [facility] [place], [place], [place]  [address] Phone: [phone]; Fax: [phone]

## 2017-10-04 ENCOUNTER — Other Ambulatory Visit: Payer: Self-pay | Admitting: Cardiology

## 2017-11-12 DIAGNOSIS — H524 Presbyopia: Secondary | ICD-10-CM | POA: Diagnosis not present

## 2018-07-01 ENCOUNTER — Encounter: Payer: Self-pay | Admitting: Internal Medicine

## 2018-07-01 ENCOUNTER — Ambulatory Visit (INDEPENDENT_AMBULATORY_CARE_PROVIDER_SITE_OTHER): Payer: 59 | Admitting: Internal Medicine

## 2018-07-01 VITALS — BP 132/84 | HR 60 | Temp 97.9°F | Ht 70.25 in | Wt 179.5 lb

## 2018-07-01 DIAGNOSIS — K219 Gastro-esophageal reflux disease without esophagitis: Secondary | ICD-10-CM | POA: Diagnosis not present

## 2018-07-01 DIAGNOSIS — Z0000 Encounter for general adult medical examination without abnormal findings: Secondary | ICD-10-CM | POA: Diagnosis not present

## 2018-07-01 DIAGNOSIS — R7301 Impaired fasting glucose: Secondary | ICD-10-CM | POA: Diagnosis not present

## 2018-07-01 DIAGNOSIS — N401 Enlarged prostate with lower urinary tract symptoms: Secondary | ICD-10-CM

## 2018-07-01 DIAGNOSIS — N138 Other obstructive and reflux uropathy: Secondary | ICD-10-CM

## 2018-07-01 DIAGNOSIS — I6529 Occlusion and stenosis of unspecified carotid artery: Secondary | ICD-10-CM | POA: Diagnosis not present

## 2018-07-01 DIAGNOSIS — I2510 Atherosclerotic heart disease of native coronary artery without angina pectoris: Secondary | ICD-10-CM | POA: Diagnosis not present

## 2018-07-01 DIAGNOSIS — Z23 Encounter for immunization: Secondary | ICD-10-CM

## 2018-07-01 LAB — HEMOGLOBIN A1C: Hgb A1c MFr Bld: 6 % (ref 4.6–6.5)

## 2018-07-01 LAB — COMPREHENSIVE METABOLIC PANEL
ALT: 21 U/L (ref 0–53)
AST: 20 U/L (ref 0–37)
Albumin: 4.6 g/dL (ref 3.5–5.2)
Alkaline Phosphatase: 59 U/L (ref 39–117)
BUN: 16 mg/dL (ref 6–23)
CO2: 27 meq/L (ref 19–32)
CREATININE: 0.81 mg/dL (ref 0.40–1.50)
Calcium: 9.8 mg/dL (ref 8.4–10.5)
Chloride: 105 mEq/L (ref 96–112)
GFR: 101.98 mL/min (ref >60.00)
Glucose, Bld: 117 mg/dL — ABNORMAL HIGH (ref 70–99)
POTASSIUM: 4.2 meq/L (ref 3.5–5.1)
SODIUM: 139 meq/L (ref 135–145)
Total Bilirubin: 0.9 mg/dL (ref 0.2–1.2)
Total Protein: 7 g/dL (ref 6.0–8.3)

## 2018-07-01 LAB — CBC
HEMATOCRIT: 46.7 % (ref 39.0–52.0)
HEMOGLOBIN: 15.5 g/dL (ref 13.0–17.0)
MCHC: 33.3 g/dL (ref 30.0–36.0)
MCV: 90 fl (ref 78.0–100.0)
Platelets: 221 10*3/uL (ref 150.0–400.0)
RBC: 5.18 Mil/uL (ref 4.22–5.81)
RDW: 13.2 % (ref 11.5–15.5)
WBC: 6.1 10*3/uL (ref 4.0–10.5)

## 2018-07-01 LAB — LIPID PANEL
CHOL/HDL RATIO: 3
Cholesterol: 134 mg/dL (ref 0–200)
HDL: 51 mg/dL (ref >39.00)
LDL CALC: 62 mg/dL (ref 0–99)
NonHDL: 82.85
TRIGLYCERIDES: 102 mg/dL (ref 0.0–149.0)
VLDL: 20.4 mg/dL (ref 0.0–40.0)

## 2018-07-01 NOTE — Assessment & Plan Note (Signed)
Better with ranitidine

## 2018-07-01 NOTE — Assessment & Plan Note (Signed)
Minor disease last time Not sure we need to keep checking

## 2018-07-01 NOTE — Assessment & Plan Note (Signed)
Will recheck

## 2018-07-01 NOTE — Assessment & Plan Note (Signed)
Doing well Colon due 2022 Defer PSA till next year at least Flu vaccine today

## 2018-07-01 NOTE — Progress Notes (Signed)
Subjective:    Patient ID: [name], male    DOB: Jul 14, 1956, 62 y.o.   MRN: [id number]  HPI Here for physical  Some ongoing urinary symptoms Some urgency/frequency--but not consistent Nocturia x 2 usually  Reviewed carotid test Last in 2016 and below 39% bilaterally  Has spot on left cheek he wants checked again Also has knot along upper right arm  Will see cardiologist again in early 2020  Current Outpatient Medications on File Prior to Visit  Medication Sig Dispense Refill  . aspirin 81 MG tablet Take 81 mg by mouth daily.      Marland Kitchen atorvastatin (LIPITOR) 40 MG tablet TAKE ONE TABLET BY MOUTH ONCE DAILY 90 tablet 3  . cetirizine (ZYRTEC) 10 MG tablet Take 10 mg by mouth daily.      . Coenzyme Q10 (CO Q 10) 100 MG CAPS Take 1 tablet by mouth daily.      Marland Kitchen lisinopril (PRINIVIL,ZESTRIL) 20 MG tablet TAKE ONE TABLET BY MOUTH ONCE DAILY 90 tablet 3  . Multiple Vitamin (MULTI VITAMIN DAILY PO) Take 1 tablet by mouth daily.    . nitroGLYCERIN (NITROSTAT) 0.4 MG SL tablet DISSOLVE ONE TABLET UNDER THE TONGUE EVERY 5 MINUTES AS NEEDED FOR CHEST PAIN.  DO NOT EXCEED A TOTAL OF 3 DOSES IN 15 MINUTES 75 tablet 1  . Omega-3 Fatty Acids (ULTRA OMEGA-3 FISH OIL) 1400 MG CAPS Take 1 capsule by mouth daily.      . ranitidine (ZANTAC) 300 MG tablet Take 1 tablet (300 mg total) by mouth at bedtime. 90 tablet 3   No current facility-administered medications on file prior to visit.     Allergies  Allergen Reactions  . Bee Venom Anaphylaxis    REACTION: anaphylaxis    Past Medical History:  Diagnosis Date  . Allergic rhinitis   . Allergy   . 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 CAD (coronary artery disease)    Cypher Stent RCA and circumflex 2003( long-term Plavix and aspirin) / nuclear 2008, no ischemia, couplets and triplets in recovery  . Carotid artery disease ([place])    Doppler, and November, 2011, normal carotid arteries, patent vertebrals  . COPD (chronic obstructive pulmonary disease) ([facility])    hx smoker, no inhalers  . Dyslipidemia   . Ejection fraction 2015   EF 55-60%, echo, stress test normal  . GERD (gastroesophageal reflux disease)   . Hemorrhoids    hx  . Hiatal hernia   . History of colonic polyps   . HTN (hypertension)   . Hyperlipidemia   . Mitral regurgitation    Mild, echo, 2008  . Zenker's diverticulum    Bleed and surgical repair    Past Surgical History:  Procedure Laterality Date  . COLONOSCOPY  2006, 2012, 2017   polyps  . LEFT WRIST  04-2009  . RIGHT SHOULDER  04-2009  . ZENKER'S DIVERTICULECTOMY ENDOSCOPIC  08-2005   repeat in 2012    Family History  Problem Relation Age of Onset  . Heart disease Father 47       MVA  . Hypertension Mother   . Coronary artery disease Mother   . Heart failure Mother   . Stroke Mother   . Heart disease Brother        CABG  . Cancer Neg Hx        no prostate or colon cancer  .  Colon cancer Neg Hx   . Rectal cancer Neg Hx   . Stomach cancer Neg Hx   . Esophageal cancer Neg Hx     Social History   Socioeconomic History  . Marital status: Divorced    Spouse name: Not on file  . Number of children: 2  . Years of education: Not on file  . Highest education level: Not on file  Occupational History  . 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 Financial resource strain: Not on file  . Food insecurity:    Worry: Not on file    Inability: Not on file  . Transportation needs:    Medical: Not on file    Non-medical: Not on file  Tobacco Use  . Smoking status: Former Smoker    Packs/day: 3.00    Years: 35.00    Pack years: 105.00    Types: Cigarettes    Last attempt to quit: 06/15/2006    Years since quitting: 12.0  . Smokeless tobacco: Never Used  Substance and Sexual Activity  . Alcohol use: Yes    Alcohol/week: 0.0 standard drinks     Comment: occasional beer  . Drug use: No  . Sexual activity: Not on file  Lifestyle  . Physical activity:    Days per week: Not on file    Minutes per session: Not on file  . Stress: Not on file  Relationships  . Social connections:    Talks on phone: Not on file    Gets together: Not on file    Attends religious service: Not on file    Active member of club or organization: Not on file    Attends meetings of clubs or organizations: Not on file    Relationship status: Not on file  . Intimate partner violence:    Fear of current or ex partner: Not on file    Emotionally abused: Not on file    Physically abused: Not on file    Forced sexual activity: Not on file  Other Topics Concern  . Not on file  Social History Narrative  . Not on file   Review of Systems  Constitutional: Negative for fatigue and unexpected weight change.       Exercising regularly Wears seat belt  HENT: Negative for dental problem, hearing loss, tinnitus and trouble swallowing.        Keeps up with dentist  Eyes: Negative for visual disturbance.       No diplopia or unilateral vision loss  Respiratory: Negative for chest tightness and shortness of breath.        Occ scratchy throat/cough  Cardiovascular: Negative for chest pain, palpitations and leg swelling.  Gastrointestinal: Negative for blood in stool and constipation.       Ranitidine really helps the heartburn  Genitourinary: Positive for frequency. Negative for dysuria.       No sexual problems  Musculoskeletal: Negative for arthralgias, back pain and joint swelling.  Skin: Negative for rash.  Neurological: Negative for dizziness, syncope and light-headedness.       Occasional headaches--?stress  Hematological: Negative for adenopathy. Does not bruise/bleed easily.  Psychiatric/Behavioral: Negative for hallucinations and sleep disturbance. The patient is not nervous/anxious.        Objective:   Physical Exam  Constitutional: He is oriented  to person, place, and time. He appears well-developed. No distress.  HENT:  Head: Normocephalic and atraumatic.  Right Ear: External ear normal.  Left Ear: External ear normal.  Mouth/Throat: Oropharynx is clear and moist. No oropharyngeal exudate.  Eyes: Pupils are equal, round, and reactive to light. Conjunctivae are normal.  Neck: No thyromegaly present.  Cardiovascular: Normal rate, regular rhythm, normal heart sounds and intact distal pulses. Exam reveals no gallop.  No murmur heard. trigeminy  Respiratory: Effort normal. No respiratory distress. He has no wheezes. He has no rales.  GI: Soft. There is no tenderness.  Musculoskeletal: He exhibits no edema or tenderness.  Lymphadenopathy:    He has no cervical adenopathy.  Neurological: He is alert and oriented to person, place, and time.  Skin: No rash noted. No erythema.  Flesh colored macular lesion on left cheek (looks benign) Benign lesion on front of right shoulder (4-8mm fleshy lesion)  Psychiatric: He has a normal mood and affect.           Assessment & Plan:

## 2018-07-01 NOTE — Assessment & Plan Note (Signed)
Still mild symptoms Not ready for meds yet

## 2018-07-01 NOTE — Assessment & Plan Note (Signed)
No symptoms Regular exercise Keeps up with cardiologist yearly

## 2018-09-18 ENCOUNTER — Encounter: Payer: Self-pay | Admitting: Cardiology

## 2018-09-30 ENCOUNTER — Other Ambulatory Visit: Payer: Self-pay | Admitting: Cardiology

## 2018-10-07 ENCOUNTER — Ambulatory Visit: Payer: 59 | Admitting: Cardiology

## 2018-10-07 ENCOUNTER — Encounter: Payer: Self-pay | Admitting: Cardiology

## 2018-10-07 VITALS — BP 140/84 | HR 67 | Ht 70.25 in | Wt 185.8 lb

## 2018-10-07 DIAGNOSIS — I493 Ventricular premature depolarization: Secondary | ICD-10-CM

## 2018-10-07 DIAGNOSIS — I2510 Atherosclerotic heart disease of native coronary artery without angina pectoris: Secondary | ICD-10-CM

## 2018-10-07 DIAGNOSIS — E7800 Pure hypercholesterolemia, unspecified: Secondary | ICD-10-CM | POA: Diagnosis not present

## 2018-10-07 DIAGNOSIS — I10 Essential (primary) hypertension: Secondary | ICD-10-CM

## 2018-10-07 NOTE — Progress Notes (Signed)
Cardiology Office Note    Date:  10/07/2018   ID:  [name], [name] 1956/06/29, MRN [id number]  PCP:  [name], MD  Cardiologist:   [name], MD (Former Dr. Ron Parker)    History of Present Illness:  [name] is a 63 y.o. 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 Ron Parker here for follow-up.   Taken off of beta blocker (was cutting in half).   He's had multiple blood pressure recordings. Overall feels well. Previously after riding his bicycle he felt his heart was racing in the 100 range for an extended period of time. He is not felt this anymore.  Normal ejection fraction. Reassuring.   He brought in his home blood pressure machine, Omron. Every now and then he will get an error a message secondary to his PVCs.  10/02/16 - chest tightness when exercising. Sat down relax. Went away. Elipical at home. 65min daily. Lost 20 pounds. Used to eat [employer]. Asymptomatic neurologically.  10/03/17 - periods of days when pulse has been 35-37. Checks it on phone, BP cuff there, manual there as well. 2 days or so.  Question about sugar increase with atorvastatin.  No further chest pain episodes.  He is exercising quite well.  R[place] his mountain bike on the [place] in [place].  Has a stump jumper.  Carbon bike.  No syncope bleeding orthopnea PND  10/07/2018-here for follow-up of coronary artery disease, hypertension.  Been doing very well.  Enjoys riding his mountain bike.  No fevers chills nausea vomiting syncope bleeding.  No anginal symptoms while riding.  Heart rate is much improved currently at 67.  Cholesterol reviewed.  Medications reviewed.  Past Medical History:  Diagnosis Date  . Allergic rhinitis   . Allergy   . 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 CAD (coronary artery disease)    Cypher Stent RCA and circumflex 2003( long-term Plavix and aspirin) / nuclear 2008, no ischemia, couplets and triplets in recovery  . Carotid artery disease ([place])    Doppler, and November, 2011, normal carotid arteries, patent vertebrals  . COPD (chronic obstructive pulmonary disease) ([facility])    hx smoker, no inhalers  . Dyslipidemia   . Ejection fraction 2015   EF 55-60%, echo, stress test normal  . GERD (gastroesophageal reflux disease)   . Hemorrhoids    hx  . Hiatal hernia   . History of colonic polyps   . HTN (hypertension)   . Hyperlipidemia   . Mitral regurgitation    Mild, echo, 2008  . Zenker's diverticulum    Bleed and surgical repair    Past Surgical History:  Procedure Laterality Date  . COLONOSCOPY  2006, 2012, 2017   polyps  . LEFT WRIST  04-2009  . RIGHT SHOULDER  04-2009  . ZENKER'S DIVERTICULECTOMY ENDOSCOPIC  08-2005   repeat in 2012    Outpatient Medications Prior to Visit  Medication Sig Dispense Refill  . aspirin 81 MG tablet Take 81 mg by mouth daily.      Marland Kitchen atorvastatin (LIPITOR) 40 MG tablet Take 1 tablet (40 mg total) by mouth daily. Please keep upcoming appt for future refills. 30 tablet 0  . cetirizine (ZYRTEC) 10 MG tablet Take 10 mg by mouth daily.      Marland Kitchen  Coenzyme Q10 (CO Q 10) 100 MG CAPS Take 1 tablet by mouth daily.      . famotidine (PEPCID) 20 MG tablet Take 20 mg by mouth daily.    Marland Kitchen lisinopril (PRINIVIL,ZESTRIL) 20 MG tablet Take 1 tablet (20 mg total) by mouth daily. Please keep upcoming appt for future refills. 30 tablet 0  . Multiple Vitamin (MULTI VITAMIN DAILY PO) Take 1 tablet by mouth daily.    . nitroGLYCERIN (NITROSTAT) 0.4 MG SL tablet DISSOLVE ONE TABLET UNDER THE TONGUE EVERY 5 MINUTES AS NEEDED FOR CHEST PAIN.  DO NOT EXCEED A TOTAL OF 3 DOSES IN 15 MINUTES 75 tablet 1  . Omega-3 Fatty Acids (ULTRA OMEGA-3 FISH OIL) 1400 MG CAPS Take 1 capsule by mouth daily.      . ranitidine  (ZANTAC) 300 MG tablet Take 1 tablet (300 mg total) by mouth at bedtime. (Patient not taking: Reported on 10/07/2018) 90 tablet 3   No facility-administered medications prior to visit.      Allergies:   Bee venom   Social History   Socioeconomic History  . Marital status: Divorced    Spouse name: Not on file  . Number of children: 2  . Years of education: Not on file  . Highest education level: Not on file  Occupational History  . 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 Financial resource strain: Not on file  . Food insecurity:    Worry: Not on file    Inability: Not on file  . Transportation needs:    Medical: Not on file    Non-medical: Not on file  Tobacco Use  . Smoking status: Former Smoker    Packs/day: 3.00    Years: 35.00    Pack years: 105.00    Types: Cigarettes    Last attempt to quit: 06/15/2006    Years since quitting: 12.3  . Smokeless tobacco: Never Used  Substance and Sexual Activity  . Alcohol use: Yes    Alcohol/week: 0.0 standard drinks    Comment: occasional beer  . Drug use: No  . Sexual activity: Not on file  Lifestyle  . Physical activity:    Days per week: Not on file    Minutes per session: Not on file  . Stress: Not on file  Relationships  . Social connections:    Talks on phone: Not on file    Gets together: Not on file    Attends religious service: Not on file    Active member of club or organization: Not on file    Attends meetings of clubs or organizations: Not on file    Relationship status: Not on file  Other Topics Concern  . Not on file  Social History Narrative  . Not on file     Family History:  The patient's family history includes Coronary artery disease in his mother; Heart disease in his brother; Heart disease (age of onset: 74) in his father; Heart failure in his mother; Hypertension in his mother; Stroke in his mother.   ROS:   Please see the history of present illness.    Review of  Systems  All other systems reviewed and are negative.     PHYSICAL EXAM:   VS:  BP 140/84   Pulse 67   Ht 5' 10.25" (1.784 m)   Wt 185 lb 12.8 oz (84.3 kg)   SpO2 95%   BMI 26.47 kg/m    GEN: Well nourished,  well developed, in no acute distress  HEENT: normal  Neck: no JVD, carotid bruits, or masses Cardiac: RRR; no murmurs, rubs, or gallops,no edema  Respiratory:  clear to auscultation bilaterally, normal work of breathing GI: soft, nontender, nondistended, + BS MS: no deformity or atrophy  Skin: warm and dry, no rash Neuro:  Alert and Oriented x 3, Strength and sensation are intact Psych: euthymic mood, full affect   Wt Readings from Last 3 Encounters:  10/07/18 185 lb 12.8 oz (84.3 kg)  07/01/18 179 lb 8 oz (81.4 kg)  10/03/17 178 lb 9.6 oz (81 kg)      Studies/Labs Reviewed:   EKG:  EKG is ordered today.  10/07/2018-normal sinus rhythm 67 no other abnormalities.  the ekg ordered today 10/02/17 - sinus bradycardia 56 with no other abnormalities personally viewed-prior 10/02/16-sinus bradycardia rate 53 with no other abnormalities personally viewed-prior demonstrates sinus rhythm, PVCs and a trigeminy pattern noted.  Recent Labs: 07/01/2018: ALT 21; BUN 16; Creatinine, Ser 0.81; Hemoglobin 15.5; Platelets 221.0; Potassium 4.2; Sodium 139   Lipid Panel    Component Value Date/Time   CHOL 134 07/01/2018 1035   TRIG 102.0 07/01/2018 1035   HDL 51.00 07/01/2018 1035   CHOLHDL 3 07/01/2018 1035   VLDL 20.4 07/01/2018 1035   LDLCALC 62 07/01/2018 1035    Additional studies/ records that were reviewed today include:  Prior office notes reviewed, lab work, stress test  LDL 62  ASSESSMENT:    1. Coronary artery disease involving native coronary artery of native heart without angina pectoris   2. Essential hypertension   3. PVC (premature ventricular contraction)   4. Pure hypercholesterolemia      PLAN:  In order of problems listed above:  1. Coronary artery  disease- stent to RCA and Circ (DES) 2003. NUC stress test over the years no ischemia. Most recent Dobutamine stress echo 07/2014 was reassuring.  No further chest discomfort.  Doing very well.  No changes.  Continue with bike riding. 2. Bradycardia-felt to be related to PACs/PVCs in the past, no documented significant bradycardia.Heart rate 53 on EKG. once again relayed that this is likely secondary to ventricular bigeminy.  Doing excellent currently.  No changes made.  Heart rate currently 67. 3. Carotid artery disease-very mild, Doppler May 2016. No bruits on exam, asymptomatic. Continue with aggressive risk factor modification.  No changes.  Continue with aggressive treatment. 4. Essential hypertension-follows it at home. Most of his readings are within normal range. Occasional white coat hypertension. Weight loss has helped.  He is doing quite well.  Stable.  Mildly elevated today but overall has been great at home. 5. Hyperlipidemia-lipid. Continue with aggressive secondary prevention. Atorvastatin 40. He is taking fish oil as well as co-every 10.  Let us continue with the atorvastatin.  I am willing to tolerate a subtle increase in blood glucose.  Benefits outweigh risks.  Hemoglobin A1c 6.1.  Continue with statin use.  Doing well no myalgias.  LDL 62.    Medication Adjustments/Labs and Tests Ordered: Current medicines are reviewed at length with the patient today.  Concerns regarding medicines are outlined above.  Medication changes, Labs and Tests ordered today are listed in the Patient Instructions below. Patient Instructions  Medication Instructions:  The current medical regimen is effective;  continue present plan and medications.  If you need a refill on your cardiac medications before your next appointment, please call your pharmacy.   Follow-Up: At [facility], you and your health needs  are our priority.  As part of our continuing mission to provide you with exceptional heart care,  we have created designated Provider Care Teams.  These Care Teams include your primary Cardiologist (physician) and Advanced Practice Providers (APPs -  Physician Assistants and Nurse Practitioners) who all work together to provide you with the care you need, when you need it. You will need a follow up appointment in 2 years.  Please call our office 2 months in advance to schedule this appointment.  You may see [name], MD or one of the following Advanced Practice Providers on your designated Care Team:   Truitt Merle, NP Cecilie Kicks, NP . Kathyrn Drown, NP  Thank you for choosing [facility]!!          Signed, [name], MD  10/07/2018 12:10 PM    [address], [place], [place]  [address] Phone: [phone]; Fax: [phone]

## 2018-10-07 NOTE — Patient Instructions (Signed)
Medication Instructions:  The current medical regimen is effective;  continue present plan and medications.  If you need a refill on your cardiac medications before your next appointment, please call your pharmacy.   Follow-Up: At [facility], you and your health needs are our priority.  As part of our continuing mission to provide you with exceptional heart care, we have created designated Provider Care Teams.  These Care Teams include your primary Cardiologist (physician) and Advanced Practice Providers (APPs -  Physician Assistants and Nurse Practitioners) who all work together to provide you with the care you need, when you need it. You will need a follow up appointment in 2 years.  Please call our office 2 months in advance to schedule this appointment.  You may see [name], MD or one of the following Advanced Practice Providers on your designated Care Team:   Truitt Merle, NP Cecilie Kicks, NP . Kathyrn Drown, NP  Thank you for choosing [facility]!!

## 2018-10-29 ENCOUNTER — Other Ambulatory Visit: Payer: Self-pay | Admitting: Cardiology

## 2018-11-17 DIAGNOSIS — H524 Presbyopia: Secondary | ICD-10-CM | POA: Diagnosis not present

## 2019-03-27 ENCOUNTER — Ambulatory Visit: Payer: 59 | Admitting: Family Medicine

## 2019-03-27 ENCOUNTER — Encounter: Payer: Self-pay | Admitting: Family Medicine

## 2019-03-27 ENCOUNTER — Other Ambulatory Visit: Payer: Self-pay

## 2019-03-27 VITALS — BP 140/92 | HR 66 | Temp 98.1°F | Ht 70.25 in | Wt 184.2 lb

## 2019-03-27 DIAGNOSIS — S90869A Insect bite (nonvenomous), unspecified foot, initial encounter: Secondary | ICD-10-CM

## 2019-03-27 DIAGNOSIS — R21 Rash and other nonspecific skin eruption: Secondary | ICD-10-CM

## 2019-03-27 DIAGNOSIS — W57XXXA Bitten or stung by nonvenomous insect and other nonvenomous arthropods, initial encounter: Secondary | ICD-10-CM

## 2019-03-27 MED ORDER — PREDNISONE 10 MG PO TABS: ORAL_TABLET | ORAL | 0 refills | Status: DC

## 2019-03-27 NOTE — Progress Notes (Signed)
Subjective:    Patient ID: [name], male    DOB: 1956/03/19, 63 y.o.   MRN: [id number]  HPI Here for bumps on legs and thighs   He was bitten by a tick on 7/16 Noticed these bumps on 7/28  ? If they were related   Bumps are on his feet,  Legs and thighs  Few on stomach   The tick bite was L inner ankle  It was red for a bit - better now  Never had a target shape   No fever No malaise No ST No joint aches   Rash it itchy  Took benadryl (last night midtnight)  The bumps get smaller with benadryl   Outdoor activities:  Mountain biking trips  In grass a lot  No animals in the house   Also takes zyrtec every day    Patient Active Problem List   Diagnosis Date Noted  . Rash and nonspecific skin eruption 03/27/2019  . Tick bite of foot, initial encounter 03/27/2019  . Elevated fasting glucose 06/28/2017  . BPH with obstruction/lower urinary tract symptoms 06/28/2017  . Tachycardia 07/23/2014  . Carotid artery disease ([place]) 12/24/2012  . CAD (coronary artery disease)   . Hyperlipidemia   . HTN (hypertension)   . GERD (gastroesophageal reflux disease)   . Zenker's diverticulum   . Bradycardia   . Ejection fraction   . Mitral regurgitation   . Routine general medical examination at a health care facility 12/05/2010  . COLONIC POLYPS, ADENOMATOUS, HX OF 08/11/2010  . ALLERGIC RHINITIS 11/28/2007   Past Medical History:  Diagnosis Date  . Allergic rhinitis   . Allergy   . 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 CAD (coronary artery disease)    Cypher Stent RCA and circumflex 2003( long-term Plavix and aspirin) / nuclear 2008, no ischemia, couplets and triplets in recovery  . Carotid artery disease ([place])    Doppler, and November, 2011, normal carotid arteries, patent vertebrals  . COPD (chronic obstructive pulmonary disease) ([facility])    hx smoker, no  inhalers  . Dyslipidemia   . Ejection fraction 2015   EF 55-60%, echo, stress test normal  . GERD (gastroesophageal reflux disease)   . Hemorrhoids    hx  . Hiatal hernia   . History of colonic polyps   . HTN (hypertension)   . Hyperlipidemia   . Mitral regurgitation    Mild, echo, 2008  . Zenker's diverticulum    Bleed and surgical repair   Past Surgical History:  Procedure Laterality Date  . COLONOSCOPY  2006, 2012, 2017   polyps  . LEFT WRIST  04-2009  . RIGHT SHOULDER  04-2009  . ZENKER'S DIVERTICULECTOMY ENDOSCOPIC  08-2005   repeat in 2012   Social History   Tobacco Use  . Smoking status: Former Smoker    Packs/day: 3.00    Years: 35.00    Pack years: 105.00    Types: Cigarettes    Quit date: 06/15/2006    Years since quitting: 12.7  . Smokeless tobacco: Never Used  Substance Use Topics  . Alcohol use: Yes    Alcohol/week: 0.0 standard drinks    Comment: occasional beer  . Drug use: No   Family History  Problem Relation Age of Onset  . Heart disease Father 52       MVA  . Hypertension Mother   .  Coronary artery disease Mother   . Heart failure Mother   . Stroke Mother   . Heart disease Brother        CABG  . Cancer Neg Hx        no prostate or colon cancer  . Colon cancer Neg Hx   . Rectal cancer Neg Hx   . Stomach cancer Neg Hx   . Esophageal cancer Neg Hx    Allergies  Allergen Reactions  . Bee Venom Anaphylaxis    REACTION: anaphylaxis   Current Outpatient Medications on File Prior to Visit  Medication Sig Dispense Refill  . aspirin 81 MG tablet Take 81 mg by mouth daily.      Marland Kitchen atorvastatin (LIPITOR) 40 MG tablet TAKE 1 TABLET BY MOUTH ONCE DAILY. PLEASE KEEP APPOINTMENT FOR FUTURE REFILLS* 90 tablet 3  . cetirizine (ZYRTEC) 10 MG tablet Take 10 mg by mouth daily.      . Coenzyme Q10 (CO Q 10) 100 MG CAPS Take 1 tablet by mouth daily.      . famotidine (PEPCID) 20 MG tablet Take 20 mg by mouth daily.    Marland Kitchen lisinopril (PRINIVIL,ZESTRIL) 20  MG tablet TAKE 1 TABLET BY MOUTH ONCE DAILY. PLEASE KEEP UPCOMING APPOINTMENT FOR FURTHER REFILLS* 90 tablet 3  . Multiple Vitamin (MULTI VITAMIN DAILY PO) Take 1 tablet by mouth daily.    . nitroGLYCERIN (NITROSTAT) 0.4 MG SL tablet DISSOLVE ONE TABLET UNDER THE TONGUE EVERY 5 MINUTES AS NEEDED FOR CHEST PAIN.  DO NOT EXCEED A TOTAL OF 3 DOSES IN 15 MINUTES 75 tablet 1  . Omega-3 Fatty Acids (ULTRA OMEGA-3 FISH OIL) 1400 MG CAPS Take 1 capsule by mouth daily.       No current facility-administered medications on file prior to visit.      Review of Systems  Constitutional: Negative for activity change, chills, diaphoresis and fever.  HENT: Negative for congestion.        No swelling of mouth or throat   Eyes: Negative for photophobia, discharge and visual disturbance.  Respiratory: Negative for cough, shortness of breath and wheezing.   Cardiovascular: Negative for chest pain and leg swelling.  Gastrointestinal: Negative for diarrhea and nausea.  Musculoskeletal: Negative for arthralgias.  Allergic/Immunologic: Positive for environmental allergies.  Neurological: Negative for dizziness and headaches.       Objective:   Physical Exam Constitutional:      General: He is not in acute distress.    Appearance: Normal appearance. He is normal weight. He is not ill-appearing.  HENT:     Head: Normocephalic and atraumatic.     Mouth/Throat:     Mouth: Mucous membranes are moist.     Pharynx: Oropharynx is clear.  Eyes:     General: No scleral icterus.       Right eye: No discharge.        Left eye: No discharge.     Extraocular Movements: Extraocular movements intact.     Conjunctiva/sclera: Conjunctivae normal.     Pupils: Pupils are equal, round, and reactive to light.  Neck:     Musculoskeletal: Normal range of motion and neck supple.  Cardiovascular:     Rate and Rhythm: Normal rate and regular rhythm.  Pulmonary:     Effort: Pulmonary effort is normal. No respiratory  distress.     Breath sounds: Normal breath sounds. No wheezing or rales.  Lymphadenopathy:     Cervical: No cervical adenopathy.  Skin:    General:  Skin is warm and dry.     Comments: Multiple papules on legs (sparing soles of feet) -erythematous with a few excoriations Different sizes No pustules or vesicles    Neurological:     Mental Status: He is alert. Mental status is at baseline.     Gait: Gait normal.  Psychiatric:        Mood and Affect: Mood normal.           Assessment & Plan:   Problem List Items Addressed This Visit      Musculoskeletal and Integument   Rash and nonspecific skin eruption - Primary    On lower extremities - (not soles of feet)  Resemble insect bites -suspect from being in the grass  Hx of tick bite (tick bourne illness less likely due to symptom profile and appearance of rash)  Tick labs drawn  tx with prednisone and antihistamine prn  Keep cool Disc use of insect repellent Update if not starting to improve in a week or if worsening        Relevant Orders   Rocky mtn spotted fvr abs pnl(IgG+IgM)   B. burgdorfi antibodies by WB   Tick bite of foot, initial encounter   Relevant Orders   Rocky mtn spotted fvr abs pnl(IgG+IgM)   B. burgdorfi antibodies by WB

## 2019-03-27 NOTE — Patient Instructions (Addendum)
Keep rash clean and dry with soap and water  Take zyrtec  Benadryl as needed for itch as well   Take prednisone as directed (it may make you hyper or hungry)   I want to do some tick fever labs on you   Watch for fever or other symptoms

## 2019-03-29 NOTE — Assessment & Plan Note (Signed)
On lower extremities - (not soles of feet)  Resemble insect bites -suspect from being in the grass  Hx of tick bite (tick bourne illness less likely due to symptom profile and appearance of rash)  Tick labs drawn  tx with prednisone and antihistamine prn  Keep cool Disc use of insect repellent Update if not starting to improve in a week or if worsening

## 2019-03-30 LAB — B. BURGDORFI ANTIBODIES BY WB

## 2019-03-30 LAB — ROCKY MTN SPOTTED FVR ABS PNL(IGG+IGM)
RMSF IgG: NOT DETECTED
RMSF IgM: NOT DETECTED

## 2019-04-21 ENCOUNTER — Ambulatory Visit (INDEPENDENT_AMBULATORY_CARE_PROVIDER_SITE_OTHER): Payer: 59

## 2019-04-21 DIAGNOSIS — Z23 Encounter for immunization: Secondary | ICD-10-CM

## 2019-04-27 ENCOUNTER — Other Ambulatory Visit: Payer: Self-pay | Admitting: *Deleted

## 2019-04-27 MED ORDER — NITROGLYCERIN 0.4 MG SL SUBL: SUBLINGUAL_TABLET | SUBLINGUAL | 3 refills | Status: DC

## 2019-07-10 ENCOUNTER — Ambulatory Visit (INDEPENDENT_AMBULATORY_CARE_PROVIDER_SITE_OTHER): Payer: Medicare Other | Admitting: Internal Medicine

## 2019-07-10 ENCOUNTER — Other Ambulatory Visit: Payer: Self-pay

## 2019-07-10 ENCOUNTER — Encounter: Payer: Self-pay | Admitting: Internal Medicine

## 2019-07-10 VITALS — BP 140/86 | HR 65 | Temp 98.4°F | Ht 70.25 in | Wt 177.0 lb

## 2019-07-10 DIAGNOSIS — Z87891 Personal history of nicotine dependence: Secondary | ICD-10-CM

## 2019-07-10 DIAGNOSIS — R7301 Impaired fasting glucose: Secondary | ICD-10-CM

## 2019-07-10 DIAGNOSIS — Z0000 Encounter for general adult medical examination without abnormal findings: Secondary | ICD-10-CM | POA: Diagnosis not present

## 2019-07-10 DIAGNOSIS — I10 Essential (primary) hypertension: Secondary | ICD-10-CM

## 2019-07-10 DIAGNOSIS — K219 Gastro-esophageal reflux disease without esophagitis: Secondary | ICD-10-CM

## 2019-07-10 DIAGNOSIS — Z125 Encounter for screening for malignant neoplasm of prostate: Secondary | ICD-10-CM

## 2019-07-10 DIAGNOSIS — I6523 Occlusion and stenosis of bilateral carotid arteries: Secondary | ICD-10-CM

## 2019-07-10 DIAGNOSIS — Z136 Encounter for screening for cardiovascular disorders: Secondary | ICD-10-CM

## 2019-07-10 DIAGNOSIS — I2510 Atherosclerotic heart disease of native coronary artery without angina pectoris: Secondary | ICD-10-CM

## 2019-07-10 DIAGNOSIS — Z7189 Other specified counseling: Secondary | ICD-10-CM

## 2019-07-10 DIAGNOSIS — N401 Enlarged prostate with lower urinary tract symptoms: Secondary | ICD-10-CM

## 2019-07-10 DIAGNOSIS — N138 Other obstructive and reflux uropathy: Secondary | ICD-10-CM

## 2019-07-10 LAB — COMPREHENSIVE METABOLIC PANEL
ALT: 20 U/L (ref 0–53)
AST: 18 U/L (ref 0–37)
Albumin: 4.5 g/dL (ref 3.5–5.2)
Alkaline Phosphatase: 63 U/L (ref 39–117)
BUN: 11 mg/dL (ref 6–23)
CO2: 27 mEq/L (ref 19–32)
Calcium: 9.1 mg/dL (ref 8.4–10.5)
Chloride: 106 mEq/L (ref 96–112)
Creatinine, Ser: 0.79 mg/dL (ref 0.40–1.50)
GFR: 98.44 mL/min (ref >60.00)
Glucose, Bld: 112 mg/dL — ABNORMAL HIGH (ref 70–99)
Potassium: 4.3 mEq/L (ref 3.5–5.1)
Sodium: 140 mEq/L (ref 135–145)
Total Bilirubin: 0.7 mg/dL (ref 0.2–1.2)
Total Protein: 6.4 g/dL (ref 6.0–8.3)

## 2019-07-10 LAB — CBC
HCT: 44.7 % (ref 39.0–52.0)
Hemoglobin: 14.6 g/dL (ref 13.0–17.0)
MCHC: 32.7 g/dL (ref 30.0–36.0)
MCV: 90.4 fl (ref 78.0–100.0)
Platelets: 215 10*3/uL (ref 150.0–400.0)
RBC: 4.95 Mil/uL (ref 4.22–5.81)
RDW: 12.8 % (ref 11.5–15.5)
WBC: 5.5 10*3/uL (ref 4.0–10.5)

## 2019-07-10 LAB — LIPID PANEL
Cholesterol: 136 mg/dL (ref 0–200)
HDL: 48.4 mg/dL (ref >39.00)
LDL Cholesterol: 65 mg/dL (ref 0–99)
NonHDL: 87.89
Total CHOL/HDL Ratio: 3
Triglycerides: 114 mg/dL (ref 0.0–149.0)
VLDL: 22.8 mg/dL (ref 0.0–40.0)

## 2019-07-10 LAB — PSA, MEDICARE: PSA: 3.5 ng/ml (ref 0.10–4.00)

## 2019-07-10 LAB — HEMOGLOBIN A1C: Hgb A1c MFr Bld: 6 % (ref 4.6–6.5)

## 2019-07-10 NOTE — Assessment & Plan Note (Signed)
Fine on the H2 blocker

## 2019-07-10 NOTE — Assessment & Plan Note (Signed)
Quiet On ARB, aspirin and statin

## 2019-07-10 NOTE — Assessment & Plan Note (Signed)
I have personally reviewed the Medicare Annual Wellness questionnaire and have noted 1. The patient's medical and social history 2. Their use of alcohol, tobacco or illicit drugs 3. Their current medications and supplements 4. The patient's functional ability including ADL's, fall risks, home safety risks and hearing or visual             impairment. 5. Diet and physical activities 6. Evidence for depression or mood disorders  The patients weight, height, BMI and visual acuity have been recorded in the chart I have made referrals, counseling and provided education to the patient based review of the above and I have provided the pt with a written personalized care plan for preventive services.  I have provided you with a copy of your personalized plan for preventive services. Please take the time to review along with your updated medication list.  Colon due 2022 Will check for AAA Discussed PSA---will check Exercises regularly Had flu vaccine Recommended shingrix at pharmacy Will give pneumovax and prevnar in the next 2 years

## 2019-07-10 NOTE — Progress Notes (Signed)
Hearing Screening   Method: Audiometry   125Hz  250Hz  500Hz  1000Hz  2000Hz  3000Hz  4000Hz  6000Hz  8000Hz   Right ear:   20 20 20  20     Left ear:   20 20 20  20       Visual Acuity Screening   Right eye Left eye Both eyes  Without correction: 20/40 20/30 20/25   With correction:

## 2019-07-10 NOTE — Assessment & Plan Note (Signed)
BP Readings from Last 3 Encounters:  07/10/19 140/86  03/27/19 (!) 140/92  10/07/18 140/84   Reasonable control

## 2019-07-10 NOTE — Assessment & Plan Note (Signed)
Mild symptoms No Rx for now 

## 2019-07-10 NOTE — Assessment & Plan Note (Signed)
See social history 

## 2019-07-10 NOTE — Assessment & Plan Note (Signed)
Mild disease No neuro symptoms On ASA and statin
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
## 2019-07-10 NOTE — Assessment & Plan Note (Signed)
Will check labs

## 2019-07-20 ENCOUNTER — Ambulatory Visit (HOSPITAL_COMMUNITY)
Admission: RE | Admit: 2019-07-20 | Discharge: 2019-07-20 | Disposition: A | Discharge status: Home / Self Care | Payer: Medicare Other | Source: Ambulatory Visit | Attending: Cardiology | Admitting: Cardiology

## 2019-07-20 ENCOUNTER — Other Ambulatory Visit: Payer: Self-pay

## 2019-07-20 DIAGNOSIS — Z87891 Personal history of nicotine dependence: Secondary | ICD-10-CM | POA: Diagnosis present

## 2019-07-20 DIAGNOSIS — Z136 Encounter for screening for cardiovascular disorders: Secondary | ICD-10-CM | POA: Insufficient documentation

## 2019-10-08 ENCOUNTER — Ambulatory Visit: Payer: Medicare Other | Attending: Internal Medicine

## 2019-10-08 DIAGNOSIS — Z23 Encounter for immunization: Secondary | ICD-10-CM | POA: Insufficient documentation

## 2019-10-08 NOTE — Progress Notes (Signed)
   Covid-19 Vaccination Clinic  Name:  [name]    MRN: [id number] DOB: March 10, 1956  10/08/2019  [name] was observed post Covid-19 immunization for 15 minutes without incidence. He was provided with Vaccine Information Sheet and instruction to access the V-Safe system.   [name] was instructed to call 911 with any severe reactions post vaccine: Marland Kitchen Difficulty breathing  . Swelling of your face and throat  . A fast heartbeat  . A bad rash all over your body  . Dizziness and weakness    Immunizations Administered    Name Date Dose VIS Date Route   Pfizer COVID-19 Vaccine 10/08/2019  8:33 AM 0.3 mL 08/07/2019 Intramuscular   Manufacturer: [place]   Lot: [id number]   NDC: [id number]

## 2019-10-21 ENCOUNTER — Other Ambulatory Visit: Payer: Self-pay

## 2019-10-21 MED ORDER — NITROGLYCERIN 0.4 MG SL SUBL: SUBLINGUAL_TABLET | SUBLINGUAL | 3 refills | Status: DC

## 2019-10-21 MED ORDER — LISINOPRIL 20 MG PO TABS: 20.0000 mg | ORAL_TABLET | Freq: Every day | ORAL | 3 refills | Status: DC

## 2019-10-21 MED ORDER — ATORVASTATIN CALCIUM 40 MG PO TABS: 40.0000 mg | ORAL_TABLET | Freq: Every day | ORAL | 3 refills | Status: DC

## 2019-11-04 ENCOUNTER — Ambulatory Visit: Payer: Medicare Other | Attending: Internal Medicine

## 2019-11-04 DIAGNOSIS — Z23 Encounter for immunization: Secondary | ICD-10-CM

## 2019-11-04 NOTE — Progress Notes (Signed)
   Covid-19 Vaccination Clinic  Name:  [name]    MRN: [id number] DOB: 11/05/1955  11/04/2019  [name] was observed post Covid-19 immunization for 15 minutes without incident. He was provided with Vaccine Information Sheet and instruction to access the V-Safe system.   [name] was instructed to call 911 with any severe reactions post vaccine: Marland Kitchen Difficulty breathing  . Swelling of face and throat  . A fast heartbeat  . A bad rash all over body  . Dizziness and weakness   Immunizations Administered    Name Date Dose VIS Date Route   Pfizer COVID-19 Vaccine 11/04/2019 11:56 AM 0.3 mL 08/07/2019 Intramuscular   Manufacturer: [place]   Lot: [id number]   [place]: [id number]

## 2020-07-15 ENCOUNTER — Encounter: Payer: Self-pay | Admitting: Internal Medicine

## 2020-07-15 ENCOUNTER — Ambulatory Visit (INDEPENDENT_AMBULATORY_CARE_PROVIDER_SITE_OTHER): Payer: Medicare Other | Admitting: Internal Medicine

## 2020-07-15 ENCOUNTER — Other Ambulatory Visit: Payer: Self-pay

## 2020-07-15 VITALS — BP 136/88 | HR 84 | Temp 97.4°F | Ht 70.25 in | Wt 179.0 lb

## 2020-07-15 DIAGNOSIS — N401 Enlarged prostate with lower urinary tract symptoms: Secondary | ICD-10-CM

## 2020-07-15 DIAGNOSIS — R7301 Impaired fasting glucose: Secondary | ICD-10-CM | POA: Diagnosis not present

## 2020-07-15 DIAGNOSIS — Z0001 Encounter for general adult medical examination with abnormal findings: Secondary | ICD-10-CM

## 2020-07-15 DIAGNOSIS — I2510 Atherosclerotic heart disease of native coronary artery without angina pectoris: Secondary | ICD-10-CM

## 2020-07-15 DIAGNOSIS — Z7189 Other specified counseling: Secondary | ICD-10-CM

## 2020-07-15 DIAGNOSIS — Z0000 Encounter for general adult medical examination without abnormal findings: Secondary | ICD-10-CM

## 2020-07-15 DIAGNOSIS — N138 Other obstructive and reflux uropathy: Secondary | ICD-10-CM

## 2020-07-15 DIAGNOSIS — K219 Gastro-esophageal reflux disease without esophagitis: Secondary | ICD-10-CM | POA: Diagnosis not present

## 2020-07-15 DIAGNOSIS — I6523 Occlusion and stenosis of bilateral carotid arteries: Secondary | ICD-10-CM | POA: Diagnosis not present

## 2020-07-15 DIAGNOSIS — Z23 Encounter for immunization: Secondary | ICD-10-CM

## 2020-07-15 LAB — LIPID PANEL
Cholesterol: 125 mg/dL (ref 0–200)
HDL: 49.8 mg/dL (ref >39.00)
LDL Cholesterol: 62 mg/dL (ref 0–99)
NonHDL: 75.52
Total CHOL/HDL Ratio: 3
Triglycerides: 67 mg/dL (ref 0.0–149.0)
VLDL: 13.4 mg/dL (ref 0.0–40.0)

## 2020-07-15 LAB — COMPREHENSIVE METABOLIC PANEL
ALT: 28 U/L (ref 0–53)
AST: 25 U/L (ref 0–37)
Albumin: 4.5 g/dL (ref 3.5–5.2)
Alkaline Phosphatase: 62 U/L (ref 39–117)
BUN: 16 mg/dL (ref 6–23)
CO2: 28 mEq/L (ref 19–32)
Calcium: 9.6 mg/dL (ref 8.4–10.5)
Chloride: 103 mEq/L (ref 96–112)
Creatinine, Ser: 0.84 mg/dL (ref 0.40–1.50)
GFR: 91.19 mL/min (ref >60.00)
Glucose, Bld: 108 mg/dL — ABNORMAL HIGH (ref 70–99)
Potassium: 4.3 mEq/L (ref 3.5–5.1)
Sodium: 139 mEq/L (ref 135–145)
Total Bilirubin: 0.9 mg/dL (ref 0.2–1.2)
Total Protein: 6.8 g/dL (ref 6.0–8.3)

## 2020-07-15 LAB — CBC
HCT: 45.3 % (ref 39.0–52.0)
Hemoglobin: 15.1 g/dL (ref 13.0–17.0)
MCHC: 33.4 g/dL (ref 30.0–36.0)
MCV: 88.6 fl (ref 78.0–100.0)
Platelets: 226 10*3/uL (ref 150.0–400.0)
RBC: 5.11 Mil/uL (ref 4.22–5.81)
RDW: 12.9 % (ref 11.5–15.5)
WBC: 5.8 10*3/uL (ref 4.0–10.5)

## 2020-07-15 LAB — HEMOGLOBIN A1C: Hgb A1c MFr Bld: 6.1 % (ref 4.6–6.5)

## 2020-07-15 NOTE — Assessment & Plan Note (Signed)
See social history 

## 2020-07-15 NOTE — Assessment & Plan Note (Signed)
Past angioplasty No symptoms On ASA and statin

## 2020-07-15 NOTE — Assessment & Plan Note (Signed)
Not symptomatic enough for Rx yet tamsulosin if worsens

## 2020-07-15 NOTE — Progress Notes (Signed)
Hearing Screening   Method: Audiometry   125Hz  250Hz  500Hz  1000Hz  2000Hz  3000Hz  4000Hz  6000Hz  8000Hz   Right ear:   20 20 20  20     Left ear:   20 20 20  20     Vision Screening Comments: May 2021

## 2020-07-15 NOTE — Assessment & Plan Note (Signed)
Controlled with nightly famotidine

## 2020-07-15 NOTE — Progress Notes (Addendum)
Subjective:    Patient ID: [name], male    DOB: 1956-08-12, 64 y.o.   MRN: [id number]  HPI Here for Medicare wellness visit and follow up of chronic health conditions This visit occurred during the SARS-CoV-2 public health emergency.  Safety protocols were in place, including screening questions prior to the visit, additional usage of staff PPE, and extensive cleaning of exam room while observing appropriate contact time as indicated for disinfecting solutions.   Reviewed advanced directives Reviewed other doctors---Dr Skains--cardiology, Dr Marta Lamas, [facility] Exercises regularly---bike. Discussed resistance (is joining gym through Medicare advantage) No tobacco Occasional beer Vision is okay Hearing is good--has some right tinnitus No falls No depression or anhedonia Independent with instrumental ADLs No memory problems  Has a spot on left cheek and lip--he wants checked Lip lesion is chronic---left cheek more recent No pain   Ongoing right shoulder problems He has had extensive surgery and implants Now with some pain in the past 2 months---was trying the rehab exercises and he feels it has helped  No chest pain Hasn't needed the nitro ASA, statin still No SOB--stable exercise tolerance No edema No palpitations  No dizziness or syncope No stroke symptoms--known carotid stenosis. Did have one spell of confusion in June (told daughter [name] instead of Happy Birthday)  Takes the famotidine daily at bedtime This prevents heartburn No dysphagia  Urine slow----some slight dribbling Not too bad Nocturia x 1-2  Current Outpatient Medications on File Prior to Visit  Medication Sig Dispense Refill  . aspirin 81 MG tablet Take 81 mg by mouth daily.      Marland Kitchen atorvastatin (LIPITOR) 40 MG tablet Take 1 tablet (40 mg total) by mouth daily at 6 PM. 90 tablet 3  . cetirizine (ZYRTEC) 10 MG tablet Take 10 mg by mouth daily.      . Coenzyme Q10 (CO Q  10) 100 MG CAPS Take 1 tablet by mouth daily.      . famotidine (PEPCID) 20 MG tablet Take 20 mg by mouth daily.    Marland Kitchen lisinopril (ZESTRIL) 20 MG tablet Take 1 tablet (20 mg total) by mouth daily. 90 tablet 3  . Multiple Vitamin (MULTI VITAMIN DAILY PO) Take 1 tablet by mouth daily.    . nitroGLYCERIN (NITROSTAT) 0.4 MG SL tablet DISSOLVE ONE TABLET UNDER THE TONGUE EVERY 5 MINUTES AS NEEDED FOR CHEST PAIN.  DO NOT EXCEED A TOTAL OF 3 DOSES IN 15 MINUTES 25 tablet 3  . Omega-3 Fatty Acids (ULTRA OMEGA-3 FISH OIL) 1400 MG CAPS Take 1 capsule by mouth daily.       No current facility-administered medications on file prior to visit.    Allergies  Allergen Reactions  . Bee Venom Anaphylaxis    REACTION: anaphylaxis    Past Medical History:  Diagnosis Date  . Allergic rhinitis   . Allergy   . 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 CAD (coronary artery disease)    Cypher Stent RCA and circumflex 2003( long-term Plavix and aspirin) / nuclear 2008, no ischemia, couplets and triplets in recovery  . Carotid artery disease ([place])    Doppler, and November, 2011, normal carotid arteries, patent vertebrals  . COPD (chronic obstructive pulmonary disease) (HCC)    hx smoker, no inhalers  . Dyslipidemia   . Ejection fraction 2015   EF 55-60%, echo, stress test normal  . GERD (gastroesophageal  reflux disease)   . Hemorrhoids    hx  . Hiatal hernia   . History of colonic polyps   . HTN (hypertension)   . Hyperlipidemia   . Mitral regurgitation    Mild, echo, 2008  . Zenker's diverticulum    Bleed and surgical repair    Past Surgical History:  Procedure Laterality Date  . COLONOSCOPY  2006, 2012, 2017   polyps  . LEFT WRIST  04-2009  . RIGHT SHOULDER  04-2009  . ZENKER'S DIVERTICULECTOMY ENDOSCOPIC  08-2005   repeat in 2012    Family History  Problem Relation Age of Onset  . Heart  disease Father 78       MVA  . Hypertension Mother   . Coronary artery disease Mother   . Heart failure Mother   . Stroke Mother   . Heart disease Brother        CABG  . Cancer Neg Hx        no prostate or colon cancer  . Colon cancer Neg Hx   . Rectal cancer Neg Hx   . Stomach cancer Neg Hx   . Esophageal cancer Neg Hx     Social History   Socioeconomic History  . Marital status: Divorced    Spouse name: Not on file  . Number of children: 2  . Years of education: Not on file  . Highest education level: Not on file  Occupational History  . 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 Smoking status: Former Smoker    Packs/day: 3.00    Years: 35.00    Pack years: 105.00    Types: Cigarettes    Quit date: 06/15/2006    Years since quitting: 14.0  . Smokeless tobacco: Never Used  Vaping Use  . Vaping Use: Never used  Substance and Sexual Activity  . Alcohol use: Yes    Alcohol/week: 0.0 standard drinks    Comment: occasional beer  . Drug use: No  . Sexual activity: Not on file  Other Topics Concern  . Not on file  Social History Narrative   Has living will   Daughters should be health care POA   Would accept resuscitation but no prolonged ventilation   No tube feeds if cognitively unaware   Social Determinants of Health   Financial Resource Strain:   . Difficulty of Paying Living Expenses: Not on file  Food Insecurity:   . Worried About Charity fundraiser in the Last Year: Not on file  . Ran Out of Food in the Last Year: Not on file  Transportation Needs:   . Lack of Transportation (Medical): Not on file  . Lack of Transportation (Non-Medical): Not on file  Physical Activity:   . Days of Exercise per Week: Not on file  . Minutes of Exercise per Session: Not on file  Stress:   . Feeling of Stress : Not on file  Social Connections:   . Frequency of Communication with Friends and Family: Not on file  . Frequency of Social  Gatherings with Friends and Family: Not on file  . Attends Religious Services: Not on file  . Active Member of Clubs or Organizations: Not on file  . Attends Archivist Meetings: Not on file  . Marital Status: Not on file  Intimate Partner Violence:   . Fear of Current or Ex-Partner: Not on file  . Emotionally Abused: Not on file  . Physically Abused:  Not on file  . Sexually Abused: Not on file   Review of Systems Appetite is good Weight is stable Sleeps well Wears seat belt Teeth okay---new crown Bowels are fine---no blood Does have some pain in right hip with certain movements---no regular problems    Objective:   Physical Exam Constitutional:      Appearance: Normal appearance.  HENT:     Mouth/Throat:     Comments: No lesions Eyes:     Conjunctiva/sclera: Conjunctivae normal.     Pupils: Pupils are equal, round, and reactive to light.  Cardiovascular:     Rate and Rhythm: Normal rate and regular rhythm.     Pulses: Normal pulses.     Heart sounds: No murmur heard.  No gallop.   Pulmonary:     Effort: Pulmonary effort is normal.     Breath sounds: Normal breath sounds. No wheezing or rales.  Abdominal:     Palpations: Abdomen is soft.     Tenderness: There is no abdominal tenderness.  Musculoskeletal:     Cervical back: Neck supple.     Right lower leg: No edema.     Left lower leg: No edema.     Comments: Fair active ROM in right shoulder except limited external rotation  Lymphadenopathy:     Cervical: No cervical adenopathy.  Skin:    Comments: Slight scaly spot on left cheek. Not inflamed (would freeze if grows) Dark spot in lower lip--stable for years  Neurological:     Mental Status: He is alert and oriented to person, place, and time.     Comments: President--- "Waymond Cera, Barack Obama" [phone] [id number] Recall 3/3  Psychiatric:        Mood and Affect: Mood normal.        Behavior: Behavior normal.             Assessment & Plan:

## 2020-07-15 NOTE — Assessment & Plan Note (Signed)
Will check labs

## 2020-07-15 NOTE — Addendum Note (Signed)
Addended by: Pilar Grammes on: 07/15/2020 11:53 AM   Modules accepted: Orders

## 2020-07-15 NOTE — Assessment & Plan Note (Signed)
Stenosis On ASA and statin Had one "event" in June---would further evaluate if any recurrences

## 2020-07-15 NOTE — Assessment & Plan Note (Signed)
I have personally reviewed the Medicare Annual Wellness questionnaire and have noted 1. The patient's medical and social history 2. Their use of alcohol, tobacco or illicit drugs 3. Their current medications and supplements 4. The patient's functional ability including ADL's, fall risks, home safety risks and hearing or visual             impairment. 5. Diet and physical activities 6. Evidence for depression or mood disorders  The patients weight, height, BMI and visual acuity have been recorded in the chart I have made referrals, counseling and provided education to the patient based review of the above and I have provided the pt with a written personalized care plan for preventive services.  I have provided you with a copy of your personalized plan for preventive services. Please take the time to review along with your updated medication list.  Colon due 2022 Defer PSA to next year Add more resistance training to biking Had COVID booster and flu vaccine Will update Pneumovax

## 2020-10-12 ENCOUNTER — Other Ambulatory Visit: Payer: Self-pay

## 2020-10-12 ENCOUNTER — Ambulatory Visit: Payer: Medicare Other | Admitting: Cardiology

## 2020-10-12 ENCOUNTER — Encounter: Payer: Self-pay | Admitting: Cardiology

## 2020-10-12 VITALS — BP 150/60 | HR 60 | Ht 70.25 in | Wt 180.0 lb

## 2020-10-12 DIAGNOSIS — I2510 Atherosclerotic heart disease of native coronary artery without angina pectoris: Secondary | ICD-10-CM | POA: Diagnosis not present

## 2020-10-12 DIAGNOSIS — I10 Essential (primary) hypertension: Secondary | ICD-10-CM | POA: Diagnosis not present

## 2020-10-12 DIAGNOSIS — E782 Mixed hyperlipidemia: Secondary | ICD-10-CM | POA: Diagnosis not present

## 2020-10-12 MED ORDER — LISINOPRIL 20 MG PO TABS
20.0000 mg | ORAL_TABLET | Freq: Every day | ORAL | 3 refills | Status: DC
Start: 2020-10-12 — End: 2020-10-28

## 2020-10-12 MED ORDER — ATORVASTATIN CALCIUM 40 MG PO TABS
40.0000 mg | ORAL_TABLET | Freq: Every day | ORAL | 3 refills | Status: DC
Start: 2020-10-12 — End: 2021-07-10

## 2020-10-12 MED ORDER — NITROGLYCERIN 0.4 MG SL SUBL
SUBLINGUAL_TABLET | SUBLINGUAL | 3 refills | Status: DC
Start: 2020-10-12 — End: 2022-01-19

## 2020-10-12 NOTE — Progress Notes (Signed)
Cardiology Office Note:    Date:  10/12/2020   ID:  [name], DOB Sep 05, 1955, MRN [id number]  PCP:  [name], MD   [place]  Cardiologist:  [name], MD  Advanced Practice Provider:  No care team member to display Electrophysiologist:  None       Referring MD: [name], MD     History of Present Illness:    [name] is a 65 y.o. male here for the follow-up of CAD prior RCA DES in 2003.  Also has hypertension hyperlipidemia relative bradycardia related to PVCs and PACs.  He used to see Dr. Ron Parker.  Enjoys riding his mountain bike.   Overall doing quite well.  New right bundle branch block noted on ECG.  Asymptomatic.  Denies any fevers chills nausea vomiting syncope chills    Past Medical History:  Diagnosis Date  . Allergic rhinitis   . Allergy   . 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 CAD (coronary artery disease)    Cypher Stent RCA and circumflex 2003( long-term Plavix and aspirin) / nuclear 2008, no ischemia, couplets and triplets in recovery  . Carotid artery disease ([place])    Doppler, and November, 2011, normal carotid arteries, patent vertebrals  . COPD (chronic obstructive pulmonary disease) (HCC)    hx smoker, no inhalers  . Dyslipidemia   . Ejection fraction 2015   EF 55-60%, echo, stress test normal  . GERD (gastroesophageal reflux disease)   . Hemorrhoids    hx  . Hiatal hernia   . History of colonic polyps   . HTN (hypertension)   . Hyperlipidemia   . Mitral regurgitation    Mild, echo, 2008  . Zenker's diverticulum    Bleed and surgical repair    Past Surgical History:  Procedure Laterality Date  . COLONOSCOPY  2006, 2012, 2017   polyps  . LEFT WRIST  04-2009  . RIGHT SHOULDER  04-2009  . ZENKER'S DIVERTICULECTOMY ENDOSCOPIC  08-2005   repeat in 2012    Current  Medications: Current Meds  Medication Sig  . aspirin 81 MG tablet Take 81 mg by mouth daily.  . cetirizine (ZYRTEC) 10 MG tablet Take 10 mg by mouth daily.  . Coenzyme Q10 (CO Q 10) 100 MG CAPS Take 1 tablet by mouth daily.  . famotidine (PEPCID) 20 MG tablet Take 20 mg by mouth daily.  . Multiple Vitamin (MULTI VITAMIN DAILY PO) Take 1 tablet by mouth daily.  . Omega-3 Fatty Acids (ULTRA OMEGA-3 FISH OIL) 1400 MG CAPS Take 1 capsule by mouth daily.  . [DISCONTINUED] atorvastatin (LIPITOR) 40 MG tablet Take 1 tablet (40 mg total) by mouth daily at 6 PM.  . [DISCONTINUED] lisinopril (ZESTRIL) 20 MG tablet Take 1 tablet (20 mg total) by mouth daily.  . [DISCONTINUED] nitroGLYCERIN (NITROSTAT) 0.4 MG SL tablet DISSOLVE ONE TABLET UNDER THE TONGUE EVERY 5 MINUTES AS NEEDED FOR CHEST PAIN.  DO NOT EXCEED A TOTAL OF 3 DOSES IN 15 MINUTES     Allergies:   Bee venom   Social History   Socioeconomic History  . Marital status: Divorced    Spouse name: Not on file  . Number of children: 2  . Years of education: Not on file  . Highest education level: Not on file  Occupational History  . 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 Smoking status: Former Smoker    Packs/day: 3.00    Years: 35.00    Pack years: 105.00    Types: Cigarettes    Quit date: 06/15/2006    Years since quitting: 14.3  . Smokeless tobacco: Never Used  Vaping Use  . Vaping Use: Never used  Substance and Sexual Activity  . Alcohol use: Yes    Alcohol/week: 0.0 standard drinks    Comment: occasional beer  . Drug use: No  . Sexual activity: Not on file  Other Topics Concern  . Not on file  Social History Narrative   Has living will   Daughters should be health care POA   Would accept resuscitation but no prolonged ventilation   No tube feeds if cognitively unaware   Social Determinants of Health   Financial Resource Strain: Not on file  Food Insecurity: Not on file  Transportation  Needs: Not on file  Physical Activity: Not on file  Stress: Not on file  Social Connections: Not on file     Family History: The patient's family history includes Coronary artery disease in his mother; Heart disease in his brother; Heart disease (age of onset: 7) in his father; Heart failure in his mother; Hypertension in his mother; Stroke in his mother. There is no history of Cancer, Colon cancer, Rectal cancer, Stomach cancer, or Esophageal cancer.  ROS:   Please see the history of present illness.     All other systems reviewed and are negative.  EKGs/Labs/Other Studies Reviewed:      EKG:  EKG is  ordered today.  The ekg ordered today demonstrates sinus rhythm 60 with right bundle branch block.  Prior EKG did not demonstrate right bundle.  Recent Labs: 07/15/2020: ALT 28; BUN 16; Creatinine, Ser 0.84; Hemoglobin 15.1; Platelets 226.0; Potassium 4.3; Sodium 139  Recent Lipid Panel    Component Value Date/Time   CHOL 125 07/15/2020 1106   TRIG 67.0 07/15/2020 1106   HDL 49.80 07/15/2020 1106   CHOLHDL 3 07/15/2020 1106   VLDL 13.4 07/15/2020 1106   LDLCALC 62 07/15/2020 1106     Risk Assessment/Calculations:      Physical Exam:    VS:  BP (!) 150/60 (BP Location: Left Arm, Patient Position: Sitting, Cuff Size: Normal)   Pulse 60   Ht 5' 10.25" (1.784 m)   Wt 180 lb (81.6 kg)   SpO2 96%   BMI 25.64 kg/m     Wt Readings from Last 3 Encounters:  10/12/20 180 lb (81.6 kg)  07/15/20 179 lb (81.2 kg)  07/10/19 177 lb (80.3 kg)     GEN:  Well nourished, well developed in no acute distress HEENT: Normal NECK: No JVD; No carotid bruits LYMPHATICS: No lymphadenopathy CARDIAC: RRR, no murmurs, rubs, gallops RESPIRATORY:  Clear to auscultation without rales, wheezing or rhonchi  ABDOMEN: Soft, non-tender, non-distended MUSCULOSKELETAL:  No edema; No deformity  SKIN: Warm and dry NEUROLOGIC:  Alert and oriented x 3 PSYCHIATRIC:  Normal affect   ASSESSMENT:     1. Coronary artery disease involving native coronary artery of native heart without angina pectoris   2. Mixed hyperlipidemia   3. Primary hypertension    PLAN:    In order of problems listed above:  New right bundle branch block -Noted on ECG today.  Should be of little to no clinical consequence.  He is not having any bradycardia.  No shortness of breath no syncope no chest  pain.  Doing quite well.  Discussed the physiology.  Coronary artery disease -Prior stenting to RCA and circumflex 2003. -2015 dobutamine stress echo was reassuring. -Aspirin, atorvastatin, ACE inhibitor.  Normal EF.  Carotid artery disease -Very mild on Doppler in 2016, no bruits appreciated.  Continue with risk factor modification.  Essential hypertension -Showed me several values from home, they look excellent.  Did have a slightly higher value today.  Continue to monitor.  Hyperlipidemia -Continue with atorvastatin 40.  Last LDL 62 hemoglobin A1c 6.1.     Medication Adjustments/Labs and Tests Ordered: Current medicines are reviewed at length with the patient today.  Concerns regarding medicines are outlined above.  Orders Placed This Encounter  Procedures  . EKG 12-Lead   Meds ordered this encounter  Medications  . atorvastatin (LIPITOR) 40 MG tablet    Sig: Take 1 tablet (40 mg total) by mouth daily at 6 PM.    Dispense:  90 tablet    Refill:  3  . lisinopril (ZESTRIL) 20 MG tablet    Sig: Take 1 tablet (20 mg total) by mouth daily.    Dispense:  90 tablet    Refill:  3  . nitroGLYCERIN (NITROSTAT) 0.4 MG SL tablet    Sig: DISSOLVE ONE TABLET UNDER THE TONGUE EVERY 5 MINUTES AS NEEDED FOR CHEST PAIN.  DO NOT EXCEED A TOTAL OF 3 DOSES IN 15 MINUTES    Dispense:  25 tablet    Refill:  3    Please consider 90 day supplies to promote better adherence    Patient Instructions  Medication Instructions:  *If you need a refill on your cardiac medications before your next appointment, please  call your pharmacy*  Lab Work: If you have labs (blood work) drawn today and your tests are completely normal, you will receive your results only by: Marland Kitchen MyChart Message (if you have MyChart) OR . A paper copy in the mail If you have any lab test that is abnormal or we need to change your treatment, we will call you to review the results.  Testing/Procedures: None ordered today.  Follow-Up: At [facility], you and your health needs are our priority.  As part of our continuing mission to provide you with exceptional heart care, we have created designated Provider Care Teams.  These Care Teams include your primary Cardiologist (physician) and Advanced Practice Providers (APPs -  Physician Assistants and Nurse Practitioners) who all work together to provide you with the care you need, when you need it.  We recommend signing up for the patient portal called "MyChart".  Sign up information is provided on this After Visit Summary.  MyChart is used to connect with patients for Virtual Visits (Telemedicine).  Patients are able to view lab/test results, encounter notes, upcoming appointments, etc.  Non-urgent messages can be sent to your provider as well.   To learn more about what you can do with MyChart, go to [email or web address].    Your next appointment:   12 month(s)  The format for your next appointment:   In Person  Provider:   You may see [name], MD or one of the following Advanced Practice Providers on your designated Care Team:    Kathyrn Drown, NP       Signed, [name], MD  10/12/2020 9:57 AM    [place]

## 2020-10-12 NOTE — Patient Instructions (Addendum)
Medication Instructions:  *If you need a refill on your cardiac medications before your next appointment, please call your pharmacy*  Lab Work: If you have labs (blood work) drawn today and your tests are completely normal, you will receive your results only by: Marland Kitchen MyChart Message (if you have MyChart) OR . A paper copy in the mail If you have any lab test that is abnormal or we need to change your treatment, we will call you to review the results.  Testing/Procedures: None ordered today.  Follow-Up: At [facility], you and your health needs are our priority.  As part of our continuing mission to provide you with exceptional heart care, we have created designated Provider Care Teams.  These Care Teams include your primary Cardiologist (physician) and Advanced Practice Providers (APPs -  Physician Assistants and Nurse Practitioners) who all work together to provide you with the care you need, when you need it.  We recommend signing up for the patient portal called "MyChart".  Sign up information is provided on this After Visit Summary.  MyChart is used to connect with patients for Virtual Visits (Telemedicine).  Patients are able to view lab/test results, encounter notes, upcoming appointments, etc.  Non-urgent messages can be sent to your provider as well.   To learn more about what you can do with MyChart, go to [email or web address].    Your next appointment:   12 month(s)  The format for your next appointment:   In Person  Provider:   You may see [name], MD or one of the following Advanced Practice Providers on your designated Care Team:    Kathyrn Drown, NP

## 2020-10-28 ENCOUNTER — Telehealth (INDEPENDENT_AMBULATORY_CARE_PROVIDER_SITE_OTHER): Payer: Medicare Other | Admitting: Cardiology

## 2020-10-28 ENCOUNTER — Encounter: Payer: Self-pay | Admitting: Cardiology

## 2020-10-28 ENCOUNTER — Other Ambulatory Visit: Payer: Self-pay

## 2020-10-28 VITALS — BP 119/71 | HR 61 | Ht 70.25 in | Wt 171.0 lb

## 2020-10-28 DIAGNOSIS — E782 Mixed hyperlipidemia: Secondary | ICD-10-CM

## 2020-10-28 DIAGNOSIS — I2510 Atherosclerotic heart disease of native coronary artery without angina pectoris: Secondary | ICD-10-CM | POA: Diagnosis not present

## 2020-10-28 DIAGNOSIS — I10 Essential (primary) hypertension: Secondary | ICD-10-CM | POA: Diagnosis not present

## 2020-10-28 MED ORDER — IRBESARTAN 300 MG PO TABS: 300.0000 mg | ORAL_TABLET | Freq: Every day | ORAL | 3 refills | Status: DC

## 2020-10-28 NOTE — Patient Instructions (Signed)
Medication Instructions:  Stop Lisinapril Start Irbesartan 300 mg daily Continue your other medication as listed  *If you need a refill on your cardiac medications before your next appointment, please call your pharmacy*     Follow-Up: At [facility], you and your health needs are our priority.  As part of our continuing mission to provide you with exceptional heart care, we have created designated Provider Care Teams.  These Care Teams include your primary Cardiologist (physician) and Advanced Practice Providers (APPs -  Physician Assistants and Nurse Practitioners) who all work together to provide you with the care you need, when you need it.  We recommend signing up for the patient portal called "MyChart".  Sign up information is provided on this After Visit Summary.  MyChart is used to connect with patients for Virtual Visits (Telemedicine).  Patients are able to view lab/test results, encounter notes, upcoming appointments, etc.  Non-urgent messages can be sent to your provider as well.   To learn more about what you can do with MyChart, go to [email or web address].    Your next appointment:   6 week(s)  The format for your next appointment:   Virtual Visit   Provider:   You may see [name], MD or one of the following Advanced Practice Providers on your designated Care Team:    Kathyrn Drown, NP

## 2020-10-28 NOTE — Progress Notes (Signed)
Virtual Visit via Video Note   This visit type was conducted due to national recommendations for restrictions regarding the COVID-19 Pandemic (e.g. social distancing) in an effort to limit this patient's exposure and mitigate transmission in our community.  Due to his co-morbid illnesses, this patient is at least at moderate risk for complications without adequate follow up.  This format is felt to be most appropriate for this patient at this time.  All issues noted in this document were discussed and addressed.  A limited physical exam was performed with this format.  Please refer to the patient's chart for his consent to telehealth for [facility].       Date:  10/28/2020   ID:  [name], DOB 06-29-1956, MRN [id number] The patient was identified using 2 identifiers.  Patient location: Home Provider Location: Home Office   PCP:  [name], MD   [place]  Cardiologist:  [name], MD  Advanced Practice Provider:  No care team member to display Electrophysiologist:  None       Evaluation Performed:  Follow-Up Visit    History of Present Illness:    [name] is a 65 y.o. 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  Prior doc was Dr. Ron Parker.  He has been measuring his blood pressures recently and they have been as high as 172/94. All prior readings were when after working out in gym.  He wanted to give me some other readings and they ended up being a little bit elevated.  Weight training  New RBBB. Bike ride in the cold.  Enjoys mountain biking on the trails here.  Also enjoying going to the gym quite a bit.  The patient does not have symptoms concerning for COVID-19 infection (fever, chills, cough, or new shortness of breath).    Past Medical History:  Diagnosis Date  . Allergic rhinitis   . Allergy   . 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 CAD (coronary artery disease)    Cypher Stent RCA and circumflex 2003( long-term Plavix and aspirin) / nuclear 2008, no ischemia, couplets and triplets in recovery  . Carotid artery disease ([place])    Doppler, and November, 2011, normal carotid arteries, patent vertebrals  . COPD (chronic obstructive pulmonary disease) (HCC)    hx smoker, no inhalers  . Dyslipidemia   . Ejection fraction 2015   EF 55-60%, echo, stress test normal  . GERD (gastroesophageal reflux disease)   . Hemorrhoids    hx  . Hiatal hernia   . History of colonic polyps   . HTN (hypertension)   . Hyperlipidemia   . Mitral regurgitation    Mild, echo, 2008  . Zenker's diverticulum    Bleed and surgical repair   Past Surgical History:  Procedure Laterality Date  . COLONOSCOPY  2006, 2012, 2017   polyps  . LEFT WRIST  04-2009  . RIGHT SHOULDER  04-2009  . ZENKER'S DIVERTICULECTOMY ENDOSCOPIC  08-2005   repeat in 2012     Current Meds  Medication Sig  . aspirin 81 MG tablet Take 81 mg by mouth daily.  Marland Kitchen atorvastatin (LIPITOR) 40 MG tablet Take 1 tablet (40 mg total) by mouth daily at 6 PM.  . cetirizine (ZYRTEC) 10 MG tablet Take 10 mg by mouth  daily.  . Coenzyme Q10 (CO Q 10) 100 MG CAPS Take 1 tablet by mouth daily.  . famotidine (PEPCID) 20 MG tablet Take 20 mg by mouth daily.  . irbesartan (AVAPRO) 300 MG tablet Take 1 tablet (300 mg total) by mouth daily.  . Multiple Vitamin (MULTI VITAMIN DAILY PO) Take 1 tablet by mouth daily.  . nitroGLYCERIN (NITROSTAT) 0.4 MG SL tablet DISSOLVE ONE TABLET UNDER THE TONGUE EVERY 5 MINUTES AS NEEDED FOR CHEST PAIN.  DO NOT EXCEED A TOTAL OF 3 DOSES IN 15 MINUTES  . Omega-3 Fatty Acids (ULTRA OMEGA-3 FISH OIL) 1400 MG CAPS Take 1 capsule by mouth daily.  . [DISCONTINUED] lisinopril (ZESTRIL) 20 MG tablet Take 1 tablet (20 mg total) by mouth daily.     Allergies:   Bee  venom   Social History   Tobacco Use  . Smoking status: Former Smoker    Packs/day: 3.00    Years: 35.00    Pack years: 105.00    Types: Cigarettes    Quit date: 06/15/2006    Years since quitting: 14.3  . Smokeless tobacco: Never Used  Vaping Use  . Vaping Use: Never used  Substance Use Topics  . Alcohol use: Yes    Alcohol/week: 0.0 standard drinks    Comment: occasional beer  . Drug use: No     Family Hx: The patient's family history includes Coronary artery disease in his mother; Heart disease in his brother; Heart disease (age of onset: 28) in his father; Heart failure in his mother; Hypertension in his mother; Stroke in his mother. There is no history of Cancer, Colon cancer, Rectal cancer, Stomach cancer, or Esophageal cancer.  ROS:   Please see the history of present illness.    No fevers chills nausea vomiting syncope bleeding All other systems reviewed and are negative.   Prior CV studies:   The following studies were reviewed today:  Catheterization stenting to RCA and circumflex in 2003  2015 dobutamine stress echo reassuring  Labs/Other Tests and Data Reviewed:    EKG:  NSR  Recent Labs: 07/15/2020: ALT 28; BUN 16; Creatinine, Ser 0.84; Hemoglobin 15.1; Platelets 226.0; Potassium 4.3; Sodium 139   Recent Lipid Panel Lab Results  Component Value Date/Time   CHOL 125 07/15/2020 11:06 AM   TRIG 67.0 07/15/2020 11:06 AM   HDL 49.80 07/15/2020 11:06 AM   CHOLHDL 3 07/15/2020 11:06 AM   LDLCALC 62 07/15/2020 11:06 AM    Wt Readings from Last 3 Encounters:  10/28/20 171 lb (77.6 kg)  10/12/20 180 lb (81.6 kg)  07/15/20 179 lb (81.2 kg)     Risk Assessment/Calculations:     Objective:    Vital Signs:  BP 119/71 (BP Location: Left Arm, Patient Position: Sitting, Cuff Size: Normal)   Pulse 61   Ht 5' 10.25" (1.784 m)   Wt 171 lb (77.6 kg)   BMI 24.36 kg/m    VITAL SIGNS:  reviewed GEN:  no acute distress EYES:  sclerae anicteric, EOMI -  Extraocular Movements Intact RESPIRATORY:  normal respiratory effort, symmetric expansion SKIN:  no rash, lesions or ulcers. MUSCULOSKELETAL:  no obvious deformities. NEURO:  alert and oriented x 3, no obvious focal deficit PSYCH:  normal affect  ASSESSMENT & PLAN:    Essential HTN --more challenging to control --irbesartan 300mg  POQD --stop lisinopril 20mg   Continue to monitor BP  PVC and PAC's --Doing well.   CAD --RCA and Circ 2003 --2015 dobutatmine stress test reassuring  Hyperlipidemia --  atorvastatin 40 mg        COVID-19 Education: The signs and symptoms of COVID-19 were discussed with the patient and how to seek care for testing (follow up with PCP or arrange E-visit).  The importance of social distancing was discussed today.  Time:   Today, I have spent 21 minutes with the patient with telehealth technology discussing the above problems.     Medication Adjustments/Labs and Tests Ordered: Current medicines are reviewed at length with the patient today.  Concerns regarding medicines are outlined above.   Tests Ordered: No orders of the defined types were placed in this encounter.   Medication Changes: Meds ordered this encounter  Medications  . irbesartan (AVAPRO) 300 MG tablet    Sig: Take 1 tablet (300 mg total) by mouth daily.    Dispense:  90 tablet    Refill:  3    Follow Up:   6 weeks virtual  Signed, [name], MD  10/28/2020 4:20 PM    [facility]

## 2020-11-08 ENCOUNTER — Encounter: Payer: Self-pay | Admitting: Internal Medicine

## 2021-01-02 ENCOUNTER — Other Ambulatory Visit: Payer: Self-pay | Admitting: *Deleted

## 2021-01-02 MED ORDER — AMLODIPINE BESYLATE 5 MG PO TABS: 5.0000 mg | ORAL_TABLET | Freq: Every day | ORAL | 3 refills | Status: DC

## 2021-01-02 NOTE — Progress Notes (Signed)
Lets add amlodipine 5 mg a day in combination with his irbesartan 300 mg.  [name], MD   Message text    Pt is aware of the above changes.  He will continue to monitor his BP/HR.

## 2021-01-12 ENCOUNTER — Other Ambulatory Visit: Payer: Self-pay

## 2021-01-12 ENCOUNTER — Ambulatory Visit (AMBULATORY_SURGERY_CENTER): Payer: Self-pay | Admitting: *Deleted

## 2021-01-12 ENCOUNTER — Encounter: Payer: Self-pay | Admitting: Internal Medicine

## 2021-01-12 VITALS — Ht 70.25 in | Wt 169.0 lb

## 2021-01-12 DIAGNOSIS — Z86010 Personal history of colonic polyps: Secondary | ICD-10-CM

## 2021-01-12 NOTE — Progress Notes (Signed)

## 2021-01-26 ENCOUNTER — Ambulatory Visit (AMBULATORY_SURGERY_CENTER): Payer: Medicare Other | Admitting: Internal Medicine

## 2021-01-26 ENCOUNTER — Encounter: Payer: Self-pay | Admitting: Internal Medicine

## 2021-01-26 ENCOUNTER — Other Ambulatory Visit: Payer: Self-pay

## 2021-01-26 VITALS — BP 113/66 | HR 63 | Temp 98.2°F | Resp 11 | Ht 70.25 in | Wt 169.0 lb

## 2021-01-26 DIAGNOSIS — K635 Polyp of colon: Secondary | ICD-10-CM

## 2021-01-26 DIAGNOSIS — D123 Benign neoplasm of transverse colon: Secondary | ICD-10-CM

## 2021-01-26 DIAGNOSIS — Z86010 Personal history of colonic polyps: Secondary | ICD-10-CM | POA: Diagnosis not present

## 2021-01-26 MED ORDER — SODIUM CHLORIDE 0.9 % IV SOLN: 500.0000 mL | Freq: Once | INTRAVENOUS | Status: DC

## 2021-01-26 NOTE — Progress Notes (Signed)
VS-CW  Pt's states no medical or surgical changes since previsit or office visit.  

## 2021-01-26 NOTE — Progress Notes (Signed)
Called to room to assist during endoscopic procedure.  Patient ID and intended procedure confirmed with present staff. Received instructions for my participation in the procedure from the performing physician.  

## 2021-01-26 NOTE — Patient Instructions (Addendum)
HANDOUTS PROVIDED: POLYPS, DIVERTICULOSIS, & HEMORRHOIDS  One tiny polyp removed.  Will get results to you but plan to repeat colonoscopy in about 5 years.  I appreciate the opportunity to care for you. Gatha Mayer, MD, FACG  YOU HAD AN ENDOSCOPIC PROCEDURE TODAY AT [facility]:   Refer to the procedure report that was given to you for any specific questions about what was found during the examination.  If the procedure report does not answer your questions, please call your gastroenterologist to clarify.  If you requested that your care partner not be given the details of your procedure findings, then the procedure report has been included in a sealed envelope for you to review at your convenience later.  YOU SHOULD EXPECT: Some feelings of bloating in the abdomen. Passage of more gas than usual.  Walking can help get rid of the air that was put into your GI tract during the procedure and reduce the bloating. If you had a lower endoscopy (such as a colonoscopy or flexible sigmoidoscopy) you may notice spotting of blood in your stool or on the toilet paper. If you underwent a bowel prep for your procedure, you may not have a normal bowel movement for a few days.  Please Note:  You might notice some irritation and congestion in your nose or some drainage.  This is from the oxygen used during your procedure.  There is no need for concern and it should clear up in a day or so.  SYMPTOMS TO REPORT IMMEDIATELY:   Following lower endoscopy (colonoscopy or flexible sigmoidoscopy):  Excessive amounts of blood in the stool  Significant tenderness or worsening of abdominal pains  Swelling of the abdomen that is new, acute  Fever of 100F or higher  For urgent or emergent issues, a gastroenterologist can be reached at any hour by calling [phone]. Do not use MyChart messaging for urgent concerns.    DIET:  We do recommend a small meal at first, but then you may proceed to  your regular diet.  Drink plenty of fluids but you should avoid alcoholic beverages for 24 hours.  ACTIVITY:  You should plan to take it easy for the rest of today and you should NOT DRIVE or use heavy machinery until tomorrow (because of the sedation medicines used during the test).    FOLLOW UP: Our staff will call the number listed on your records Monday morning between 7:15 am and 8:15 am following your procedure to check on you and address any questions or concerns that you may have regarding the information given to you following your procedure. If we do not reach you, we will leave a message.  We will attempt to reach you two times.  During this call, we will ask if you have developed any symptoms of COVID 19. If you develop any symptoms (ie: fever, flu-like symptoms, shortness of breath, cough etc.) before then, please call [phone].  If you test positive for Covid 19 in the 2 weeks post procedure, please call and report this information to [place].    If any biopsies were taken you will be contacted by phone or by letter within the next 1-3 weeks.  Please call us at [phone] if you have not heard about the biopsies in 3 weeks.    SIGNATURES/CONFIDENTIALITY: You and/or your care partner have signed paperwork which will be entered into your electronic medical record.  These signatures attest to the fact that that the information  above on your After Visit Summary has been reviewed and is understood.  Full responsibility of the confidentiality of this discharge information lies with you and/or your care-partner.

## 2021-01-26 NOTE — Progress Notes (Signed)
PT taken to PACU. Monitors in place. VSS. Report given to RN. 

## 2021-01-26 NOTE — Op Note (Signed)
[facility] Patient Name: [name] Procedure Date: 01/26/2021 9:42 AM MRN: [id number] Endoscopist: Gatha Mayer , MD Age: 65 Referring MD:  Date of Birth: 1956/06/25 Gender: Male Account #: [id number] Procedure:                Colonoscopy Indications:              Surveillance: Personal history of adenomatous                            polyps on last colonoscopy 5 years ago Medicines:                Propofol per Anesthesia, Monitored Anesthesia Care Procedure:                Pre-Anesthesia Assessment:                           - Prior to the procedure, a History and Physical                            was performed, and patient medications and                            allergies were reviewed. The patient's tolerance of                            previous anesthesia was also reviewed. The risks                            and benefits of the procedure and the sedation                            options and risks were discussed with the patient.                            All questions were answered, and informed consent                            was obtained. Prior Anticoagulants: The patient has                            taken no previous anticoagulant or antiplatelet                            agents. ASA Grade Assessment: III - A patient with                            severe systemic disease. After reviewing the risks                            and benefits, the patient was deemed in                            satisfactory condition to undergo the procedure.  After obtaining informed consent, the colonoscope                            was passed under direct vision. Throughout the                            procedure, the patient's blood pressure, pulse, and                            oxygen saturations were monitored continuously. The                            Olympus CF-HQ190L ([id number]) Colonoscope was                             introduced through the anus and advanced to the the                            cecum, identified by appendiceal orifice and                            ileocecal valve. The colonoscopy was performed                            without difficulty. The patient tolerated the                            procedure well. The quality of the bowel                            preparation was adequate. The ileocecal valve,                            appendiceal orifice, and rectum were photographed.                            The bowel preparation used was Miralax via split                            dose instruction. Scope In: 9:56:51 AM Scope Out: 10:14:45 AM Scope Withdrawal Time: 0 hours 11 minutes 32 seconds  Total Procedure Duration: 0 hours 17 minutes 54 seconds  Findings:                 The perianal and digital rectal examinations were                            normal. Pertinent negatives include normal prostate                            (size, shape, and consistency).                           A diminutive polyp was found in the transverse  colon. The polyp was flat. The polyp was removed                            with a cold snare. Resection and retrieval were                            complete. Verification of patient identification                            for the specimen was done. Estimated blood loss was                            minimal.                           Multiple diverticula were found in the sigmoid                            colon and descending colon.                           Internal hemorrhoids were found.                           The exam was otherwise without abnormality on                            direct and retroflexion views. Complications:            No immediate complications. Estimated Blood Loss:     Estimated blood loss was minimal. Impression:               - One diminutive polyp in the transverse colon,                             removed with a cold snare. Resected and retrieved.                           - Diverticulosis in the sigmoid colon and in the                            descending colon.                           - Internal hemorrhoids.                           - The examination was otherwise normal on direct                            and retroflexion views.                           - Personal history of colonic polyps.                           2006  1 adenoma                           2012 diminutive adenomas                           01/09/2016 3 diminutive polyps -2 were adenomas Recommendation:           - Patient has a contact number available for                            emergencies. The signs and symptoms of potential                            delayed complications were discussed with the                            patient. Return to normal activities tomorrow.                            Written discharge instructions were provided to the                            patient.                           - Resume previous diet.                           - Continue present medications.                           - Repeat colonoscopy in 5 years for surveillance.                            Hx polyps and adeauate prep today - Right colon                            needed fair amount of lavage - consider different                            prep next time Gatha Mayer, MD 01/26/2021 10:22:21 AM This report has been signed electronically.

## 2021-01-30 ENCOUNTER — Telehealth: Payer: Self-pay | Admitting: *Deleted

## 2021-01-30 NOTE — Telephone Encounter (Signed)
  Follow up Call-  Call back number 01/26/2021  Post procedure Call Back phone  # [phone]  Permission to leave phone message Yes  Some recent data might be hidden     Patient questions:  Do you have a fever, pain , or abdominal swelling? No. Pain Score  0 *  Have you tolerated food without any problems? Yes.    Have you been able to return to your normal activities? Yes.    Do you have any questions about your discharge instructions: Diet   No. Medications  No. Follow up visit  No.  Do you have questions or concerns about your Care? No.  Actions: * If pain score is 4 or above: No action needed, pain <4.  1. Have you developed a fever since your procedure? no  2.   Have you had an respiratory symptoms (SOB or cough) since your procedure? no  3.   Have you tested positive for COVID 19 since your procedure no  4.   Have you had any family members/close contacts diagnosed with the COVID 19 since your procedure?  no   If yes to any of these questions please route to Joylene John, RN and Joella Prince, RN

## 2021-02-08 ENCOUNTER — Encounter: Payer: Self-pay | Admitting: Internal Medicine

## 2021-03-02 NOTE — Telephone Encounter (Signed)
Cough and burning in chest started 02-27-21. Yesterday morning he tried to go bike riding and he felt weak. Muscles ached. Fever started yesterday afternoon. Highest 101.7 Right now 99.5 Has a slight headache. 2 vaccines and 1 booster. Says he does not need anything for the cough right now. Uses [facility]   He is aware it will be this afternoon before he hears back.

## 2021-07-08 ENCOUNTER — Other Ambulatory Visit: Payer: Self-pay | Admitting: Cardiology

## 2021-07-08 DIAGNOSIS — E782 Mixed hyperlipidemia: Secondary | ICD-10-CM

## 2021-07-17 ENCOUNTER — Other Ambulatory Visit: Payer: Self-pay

## 2021-07-17 MED ORDER — AMLODIPINE BESYLATE 5 MG PO TABS: 5.0000 mg | ORAL_TABLET | Freq: Every day | ORAL | 0 refills | Status: DC

## 2021-07-19 ENCOUNTER — Encounter: Payer: Medicare Other | Admitting: Internal Medicine

## 2021-07-19 ENCOUNTER — Encounter: Payer: 59 | Admitting: Internal Medicine

## 2021-08-02 ENCOUNTER — Ambulatory Visit (INDEPENDENT_AMBULATORY_CARE_PROVIDER_SITE_OTHER): Payer: Medicare Other | Admitting: Internal Medicine

## 2021-08-02 ENCOUNTER — Other Ambulatory Visit: Payer: Self-pay

## 2021-08-02 ENCOUNTER — Encounter: Payer: Self-pay | Admitting: Internal Medicine

## 2021-08-02 VITALS — BP 150/90 | HR 64 | Temp 97.5°F | Ht 70.0 in | Wt 171.0 lb

## 2021-08-02 DIAGNOSIS — N138 Other obstructive and reflux uropathy: Secondary | ICD-10-CM

## 2021-08-02 DIAGNOSIS — Z125 Encounter for screening for malignant neoplasm of prostate: Secondary | ICD-10-CM

## 2021-08-02 DIAGNOSIS — K219 Gastro-esophageal reflux disease without esophagitis: Secondary | ICD-10-CM

## 2021-08-02 DIAGNOSIS — I6523 Occlusion and stenosis of bilateral carotid arteries: Secondary | ICD-10-CM | POA: Diagnosis not present

## 2021-08-02 DIAGNOSIS — I2510 Atherosclerotic heart disease of native coronary artery without angina pectoris: Secondary | ICD-10-CM | POA: Diagnosis not present

## 2021-08-02 DIAGNOSIS — N401 Enlarged prostate with lower urinary tract symptoms: Secondary | ICD-10-CM | POA: Diagnosis not present

## 2021-08-02 DIAGNOSIS — R7301 Impaired fasting glucose: Secondary | ICD-10-CM

## 2021-08-02 DIAGNOSIS — Z0000 Encounter for general adult medical examination without abnormal findings: Secondary | ICD-10-CM

## 2021-08-02 LAB — COMPREHENSIVE METABOLIC PANEL
ALT: 17 U/L (ref 0–53)
AST: 20 U/L (ref 0–37)
Albumin: 4.3 g/dL (ref 3.5–5.2)
Alkaline Phosphatase: 64 U/L (ref 39–117)
BUN: 17 mg/dL (ref 6–23)
CO2: 29 mEq/L (ref 19–32)
Calcium: 9.7 mg/dL (ref 8.4–10.5)
Chloride: 105 mEq/L (ref 96–112)
Creatinine, Ser: 0.88 mg/dL (ref 0.40–1.50)
GFR: 89.26 mL/min (ref >60.00)
Glucose, Bld: 102 mg/dL — ABNORMAL HIGH (ref 70–99)
Potassium: 5.1 mEq/L (ref 3.5–5.1)
Sodium: 140 mEq/L (ref 135–145)
Total Bilirubin: 0.9 mg/dL (ref 0.2–1.2)
Total Protein: 6.4 g/dL (ref 6.0–8.3)

## 2021-08-02 LAB — LIPID PANEL
Cholesterol: 127 mg/dL (ref 0–200)
HDL: 54.7 mg/dL (ref >39.00)
LDL Cholesterol: 53 mg/dL (ref 0–99)
NonHDL: 71.88
Total CHOL/HDL Ratio: 2
Triglycerides: 93 mg/dL (ref 0.0–149.0)
VLDL: 18.6 mg/dL (ref 0.0–40.0)

## 2021-08-02 LAB — CBC
HCT: 45 % (ref 39.0–52.0)
Hemoglobin: 14.4 g/dL (ref 13.0–17.0)
MCHC: 32.1 g/dL (ref 30.0–36.0)
MCV: 89.8 fl (ref 78.0–100.0)
Platelets: 233 10*3/uL (ref 150.0–400.0)
RBC: 5.01 Mil/uL (ref 4.22–5.81)
RDW: 13.7 % (ref 11.5–15.5)
WBC: 5.9 10*3/uL (ref 4.0–10.5)

## 2021-08-02 LAB — PSA, MEDICARE: PSA: 2.9 ng/ml (ref 0.10–4.00)

## 2021-08-02 NOTE — Assessment & Plan Note (Signed)
Stable mild symptoms If worsens, would start tamsulosin

## 2021-08-02 NOTE — Assessment & Plan Note (Signed)
I have personally reviewed the Medicare Annual Wellness questionnaire and have noted 1. The patient's medical and social history 2. Their use of alcohol, tobacco or illicit drugs 3. Their current medications and supplements 4. The patient's functional ability including ADL's, fall risks, home safety risks and hearing or visual             impairment. 5. Diet and physical activities 6. Evidence for depression or mood disorders  The patients weight, height, BMI and visual acuity have been recorded in the chart I have made referrals, counseling and provided education to the patient based review of the above and I have provided the pt with a written personalized care plan for preventive services.  I have provided you with a copy of your personalized plan for preventive services. Please take the time to review along with your updated medication list.  Colon due again 2029 Discussed PSA ---will check again Had flu vaccine and bivalent COVID Exercises regularly

## 2021-08-02 NOTE — Progress Notes (Signed)
Hearing Screening - Comments:: Passed whisper test Vision Screening - Comments:: April 2022

## 2021-08-02 NOTE — Assessment & Plan Note (Signed)
Mild Will check A1c as well

## 2021-08-02 NOTE — Assessment & Plan Note (Signed)
Quiet on avapro, asa, atorvastatin

## 2021-08-02 NOTE — Assessment & Plan Note (Signed)
Does okay with pepcid

## 2021-08-02 NOTE — Patient Instructions (Signed)
Go ahead and restart the amlodipine.

## 2021-08-02 NOTE — Progress Notes (Signed)
Subjective:    Patient ID: [name], male    DOB: Aug 28, 1955, 65 y.o.   MRN: [id number]  HPI Here for Medicare wellness visit and follow up of chronic health conditions This visit occurred during the SARS-CoV-2 public health emergency.  Safety protocols were in place, including screening questions prior to the visit, additional usage of staff PPE, and extensive cleaning of exam room while observing appropriate contact time as indicated for disinfecting solutions.   Reviewed form and advanced directives Reviewed other doctors---Dr Delrae Rend, Dr Skains--cardiology, Dr Marta Lamas, [facility] No hospitalizations or surgery in the past year Vision is fine Hearing is fine No alcohol or tobacco Exercises regularly No depression or anhedonia Independent with instrumental ADLs No sig memory issues  Mid August--he thought he injured right foot Had some swelling --no pain though Wondered about amlodipine causing this (so he stopped it) Swelling didn't get better Hasn't restarted it yet-----BP at home mildly elevated (median 145/83)  Had little cramp in lateral right calf recently (a week ago) Then saw some blood  Wonders about tylenol use Will use 500 or at ost 1000mg  daily Wants to know if safe  No chest pain or SOB No palpitations No dizziness or syncope Foot is still "puffy"  Takes pepcid regularly Rare heartburn No dysphagia  Slow urinary stream Some dribbling Nocturia x 1---occ 2 or more  Current Outpatient Medications on File Prior to Visit  Medication Sig Dispense Refill   aspirin 81 MG tablet Take 81 mg by mouth daily.     atorvastatin (LIPITOR) 40 MG tablet TAKE 1 TABLET BY MOUTH ONCE DAILY AT  6PM 90 tablet 0   cetirizine (ZYRTEC) 10 MG tablet Take 10 mg by mouth daily.     cholecalciferol (VITAMIN D3) 25 MCG (1000 UNIT) tablet Take 1,000 Units by mouth daily.     Coenzyme Q10 (CO Q 10) 100 MG CAPS Take 1 tablet by mouth daily.     famotidine  (PEPCID) 20 MG tablet Take 20 mg by mouth daily.     irbesartan (AVAPRO) 300 MG tablet Take 1 tablet by mouth once daily 90 tablet 0   Multiple Vitamin (MULTI VITAMIN DAILY PO) Take 1 tablet by mouth daily.     nitroGLYCERIN (NITROSTAT) 0.4 MG SL tablet DISSOLVE ONE TABLET UNDER THE TONGUE EVERY 5 MINUTES AS NEEDED FOR CHEST PAIN.  DO NOT EXCEED A TOTAL OF 3 DOSES IN 15 MINUTES 25 tablet 3   Omega-3 Fatty Acids (ULTRA OMEGA-3 FISH OIL) 1400 MG CAPS Take 1 capsule by mouth daily.     amLODipine (NORVASC) 5 MG tablet Take 1 tablet (5 mg total) by mouth daily. (Patient not taking: Reported on 08/02/2021) 90 tablet 0   No current facility-administered medications on file prior to visit.    Allergies  Allergen Reactions   Bee Venom Anaphylaxis    REACTION: anaphylaxis    Past Medical History:  Diagnosis Date   Allergic rhinitis    Allergy    Bradycardia    Relative bradycardia related to PACs and PVCs when measuring heart rate, benign, effects ability to measure blood pressure   CAD (coronary artery disease)    Cypher Stent RCA and circumflex 2003( long-term Plavix and aspirin) / nuclear 2008, no ischemia, couplets and triplets in recovery   Carotid artery disease ([place])    Doppler, and November, 2011, normal carotid arteries, patent vertebrals   COPD (chronic obstructive pulmonary disease) ([place])    hx smoker, no inhalers   Dyslipidemia  Ejection fraction 2015   EF 55-60%, echo, stress test normal   GERD (gastroesophageal reflux disease)    Hemorrhoids    hx   Hiatal hernia    History of colonic polyps    HTN (hypertension)    Hyperlipidemia    Mitral regurgitation    Mild, echo, 2008   Zenker's diverticulum    Bleed and surgical repair    Past Surgical History:  Procedure Laterality Date   COLONOSCOPY  2006, 2012, 2017   polyps   CORONARY ANGIOPLASTY WITH STENT PLACEMENT  02/2002   x2   LEFT WRIST  04-2009   RIGHT SHOULDER  04-2009   ZENKER'S DIVERTICULECTOMY ENDOSCOPIC   08-2005   repeat in 2012    Family History  Problem Relation Age of Onset   Heart disease Father 39       MVA   Hypertension Mother    Coronary artery disease Mother    Heart failure Mother    Stroke Mother    Heart disease Brother        CABG   Cancer Neg Hx        no prostate or colon cancer   Colon cancer Neg Hx    Rectal cancer Neg Hx    Stomach cancer Neg Hx    Esophageal cancer Neg Hx     Social History   Socioeconomic History   Marital status: Divorced    Spouse name: Not on file   Number of children: 2   Years of education: Not on file   Highest education level: Not on file  Occupational History   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  Tobacco Use   Smoking status: Former    Packs/day: 3.00    Years: 35.00    Pack years: 105.00    Types: Cigarettes    Quit date: 06/15/2006    Years since quitting: 15.1   Smokeless tobacco: Never  Vaping Use   Vaping Use: Never used  Substance and Sexual Activity   Alcohol use: Yes    Alcohol/week: 0.0 standard drinks    Comment: occasional beer   Drug use: No   Sexual activity: Not on file  Other Topics Concern   Not on file  Social History Narrative   Has living will   Daughters should be health care POA   Would accept resuscitation but no prolonged ventilation   No tube feeds if cognitively unaware   Social Determinants of Health   Financial Resource Strain: Not on file  Food Insecurity: Not on file  Transportation Needs: Not on file  Physical Activity: Not on file  Stress: Not on file  Social Connections: Not on file  Intimate Partner Violence: Not on file   Review of Systems Appetite is good Weight down slightly Sleeps well Wears seat belt Teeth are fine--sees dentist No worrisome skin lesions---same mole on back to be checked Bowels fine--no blood No sig back or joint pains--will have some soreness in various places (like after riding his bike)     Objective:   Physical  Exam Constitutional:      Appearance: Normal appearance.  HENT:     Mouth/Throat:     Comments: No lesions Eyes:     Conjunctiva/sclera: Conjunctivae normal.     Pupils: Pupils are equal, round, and reactive to light.  Cardiovascular:     Rate and Rhythm: Normal rate and regular rhythm.     Pulses: Normal pulses.  Heart sounds: No murmur heard.   No gallop.  Pulmonary:     Effort: Pulmonary effort is normal.     Breath sounds: Normal breath sounds. No wheezing or rales.  Abdominal:     Palpations: Abdomen is soft.     Tenderness: There is no abdominal tenderness.  Musculoskeletal:     Cervical back: Neck supple.     Right lower leg: No edema.     Left lower leg: No edema.  Lymphadenopathy:     Cervical: No cervical adenopathy.  Skin:    Findings: No rash.     Comments: Seb keratosis on back Multiple cherry angiomas on chest  Neurological:     Mental Status: He is alert and oriented to person, place, and time.     Comments: President---"Joe Balinda Quails Obama" [phone] [id number] Recall 2/3  Psychiatric:        Mood and Affect: Mood normal.        Behavior: Behavior normal.           Assessment & Plan:

## 2021-08-02 NOTE — Assessment & Plan Note (Signed)
Known past disease Is on ASA and statin

## 2021-08-30 ENCOUNTER — Encounter: Payer: Self-pay | Admitting: Physician Assistant

## 2021-08-30 ENCOUNTER — Other Ambulatory Visit: Payer: Self-pay

## 2021-08-30 ENCOUNTER — Ambulatory Visit: Payer: Medicare Other | Admitting: Physician Assistant

## 2021-08-30 DIAGNOSIS — Z1283 Encounter for screening for malignant neoplasm of skin: Secondary | ICD-10-CM

## 2021-08-30 DIAGNOSIS — D3612 Benign neoplasm of peripheral nerves and autonomic nervous system, upper limb, including shoulder: Secondary | ICD-10-CM

## 2021-08-30 DIAGNOSIS — D485 Neoplasm of uncertain behavior of skin: Secondary | ICD-10-CM

## 2021-08-30 NOTE — Patient Instructions (Signed)

## 2021-09-04 ENCOUNTER — Encounter: Payer: Self-pay | Admitting: Physician Assistant

## 2021-09-04 NOTE — Progress Notes (Signed)
° °  New Patient   Subjective  [name] is a 66 y.o. male who presents for the following: Annual Exam (Here for skin exam. No concerns. /No history of skin cancers. ).   The following portions of the chart were reviewed this encounter and updated as appropriate:  Tobacco   Allergies   Meds   Problems   Med Hx   Surg Hx   Fam Hx       Objective  Well appearing patient in no apparent distress; mood and affect are within normal limits.  All skin waist up and legs examined.   Right Shoulder - Anterior Pearly pink papule        Assessment & Plan  Neoplasm of uncertain behavior of skin Right Shoulder - Anterior  Skin / nail biopsy Type of biopsy: tangential   Informed consent: discussed and consent obtained   Timeout: patient name, date of birth, surgical site, and procedure verified   Procedure prep:  Patient was prepped and draped in usual sterile fashion (Non sterile) Prep type:  Chlorhexidine Anesthesia: the lesion was anesthetized in a standard fashion   Anesthetic:  1% lidocaine w/ epinephrine 1-100,000 local infiltration Instrument used: flexible razor blade   Hemostasis achieved with: aluminum chloride and electrodesiccation   Outcome: patient tolerated procedure well   Post-procedure details: sterile dressing applied and wound care instructions given   Dressing type: bandage and petrolatum    Specimen 1 - Surgical pathology Differential Diagnosis: R/O BCC vs SCC - cautery after biopsy  Check Margins: Yes   No atypical nevi noted at the time of the visit.  I, Davide Risdon, PA-C, have reviewed all documentation's for this visit.  The documentation on 09/04/21 for the exam, diagnosis, procedures and orders are all accurate and complete.

## 2021-09-05 ENCOUNTER — Telehealth: Payer: Self-pay | Admitting: *Deleted

## 2021-09-05 ENCOUNTER — Encounter: Payer: Self-pay | Admitting: Physician Assistant

## 2021-09-05 NOTE — Telephone Encounter (Signed)
Pathology to patient.  °

## 2021-09-05 NOTE — Telephone Encounter (Signed)
Left message for patient to return our phone call for pathology results.

## 2021-12-02 ENCOUNTER — Other Ambulatory Visit: Payer: Self-pay | Admitting: Cardiology

## 2021-12-18 ENCOUNTER — Ambulatory Visit: Payer: Medicare Other | Admitting: Cardiology

## 2021-12-18 ENCOUNTER — Encounter: Payer: Self-pay | Admitting: Cardiology

## 2021-12-18 DIAGNOSIS — E782 Mixed hyperlipidemia: Secondary | ICD-10-CM | POA: Diagnosis not present

## 2021-12-18 DIAGNOSIS — I2510 Atherosclerotic heart disease of native coronary artery without angina pectoris: Secondary | ICD-10-CM | POA: Diagnosis not present

## 2021-12-18 DIAGNOSIS — I10 Essential (primary) hypertension: Secondary | ICD-10-CM | POA: Diagnosis not present

## 2021-12-18 DIAGNOSIS — I6523 Occlusion and stenosis of bilateral carotid arteries: Secondary | ICD-10-CM

## 2021-12-18 DIAGNOSIS — I493 Ventricular premature depolarization: Secondary | ICD-10-CM

## 2021-12-18 NOTE — Assessment & Plan Note (Addendum)
Previously changed lisinopril 20 mg over to irbesartan 300 mg.  Also on amlodipine 5 mg daily.  Dr. Silvio Pate has been monitoring.  He showed me several tracings today.  Doing very well.  He is at goal. ?

## 2021-12-18 NOTE — Assessment & Plan Note (Signed)
Mild carotid disease.  Continue with goal-directed medical therapy.  Atorvastatin 40 mg.  Aspirin 81. ?

## 2021-12-18 NOTE — Assessment & Plan Note (Addendum)
On atorvastatin 40 mg once a day.  LDL goal less than 70, currently 53.  Last check.  No myalgias.  Doing well with current medication management we will continue to prescribe. ?

## 2021-12-18 NOTE — Progress Notes (Signed)
?Cardiology Office Note:   ? ?Date:  12/18/2021  ? ?ID:  [name], DOB 1956-04-14, MRN [id number] ? ?PCP:  [name], MD ?  ?[facility] Providers ?Cardiologist:  [name], MD    ? ?Referring MD: [name], MD  ? ? ?History of Present Illness:   ? ?[name] is a 66 y.o. male here for the Kramer of coronary artery disease status post RCA drug-eluting stent placed in 2003 with relative bradycardia secondary to PVCs and PACs, hypertension, hyperlipidemia.  Prior physician was Dr. Ron Parker. ? ?Enjoys mountain biking here on trials.  Enjoys the gym.  Prior right bundle branch block discovered. ? ?Blood pressures in the past have been elevated.  He showed me several readings from home.  Statistics involved.  Overall he is 133/74.  Quite good. ? ?We talked about this.  He does not need to take it quite as often. ? ?Past Medical History:  ?Diagnosis Date  ? Allergic rhinitis   ? Allergy   ? Bradycardia   ? Relative bradycardia related to PACs and PVCs when measuring heart rate, benign, effects ability to measure blood pressure  ? CAD (coronary artery disease)   ? Cypher Stent RCA and circumflex 2003( long-term Plavix and aspirin) / nuclear 2008, no ischemia, couplets and triplets in recovery  ? Carotid artery disease ([place])   ? Doppler, and November, 2011, normal carotid arteries, patent vertebrals  ? COPD (chronic obstructive pulmonary disease) ([place])   ? hx smoker, no inhalers  ? Dyslipidemia   ? Ejection fraction 2015  ? EF 55-60%, echo, stress test normal  ? GERD (gastroesophageal reflux disease)   ? Hemorrhoids   ? hx  ? Hiatal hernia   ? History of colonic polyps   ? HTN (hypertension)   ? Hyperlipidemia   ? Mitral regurgitation   ? Mild, echo, 2008  ? Zenker's diverticulum   ? Bleed and surgical repair  ? ? ?Past Surgical History:  ?Procedure Laterality Date  ? COLONOSCOPY  2006, 2012, 2017  ? polyps  ? CORONARY ANGIOPLASTY WITH STENT PLACEMENT  02/2002  ? x2  ? LEFT WRIST  04-2009  ?  RIGHT SHOULDER  04-2009  ? ZENKER'S DIVERTICULECTOMY ENDOSCOPIC  08-2005  ? repeat in 2012  ? ? ?Current Medications: ?Current Meds  ?Medication Sig  ? amLODipine (NORVASC) 5 MG tablet Take 1 tablet (5 mg total) by mouth daily.  ? aspirin 81 MG tablet Take 81 mg by mouth daily.  ? atorvastatin (LIPITOR) 40 MG tablet TAKE 1 TABLET BY MOUTH ONCE DAILY AT  6PM  ? cetirizine (ZYRTEC) 10 MG tablet Take 10 mg by mouth daily.  ? cholecalciferol (VITAMIN D3) 25 MCG (1000 UNIT) tablet Take 1,000 Units by mouth daily.  ? Coenzyme Q10 (CO Q 10) 100 MG CAPS Take 1 tablet by mouth daily.  ? famotidine (PEPCID) 20 MG tablet Take 20 mg by mouth daily.  ? irbesartan (AVAPRO) 300 MG tablet Take 1 tablet by mouth once daily  ? Multiple Vitamin (MULTI VITAMIN DAILY PO) Take 1 tablet by mouth daily.  ? nitroGLYCERIN (NITROSTAT) 0.4 MG SL tablet DISSOLVE ONE TABLET UNDER THE TONGUE EVERY 5 MINUTES AS NEEDED FOR CHEST PAIN.  DO NOT EXCEED A TOTAL OF 3 DOSES IN 15 MINUTES  ? Omega-3 Fatty Acids (ULTRA OMEGA-3 FISH OIL) 1400 MG CAPS Take 1 capsule by mouth daily.  ?  ? ?Allergies:   Bee venom  ? ?Social History  ? ?Socioeconomic  History  ? Marital status: Divorced  ?  Spouse name: Not on file  ? Number of children: 2  ? Years of education: Not on file  ? Highest education level: Not on file  ?Occupational History  ? Occupation: Merchant [employer] officer [employer]  ?  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  ?Tobacco Use  ? Smoking status: Former  ?  Packs/day: 3.00  ?  Years: 35.00  ?  Pack years: 105.00  ?  Types: Cigarettes  ?  Quit date: 06/15/2006  ?  Years since quitting: 15.5  ? Smokeless tobacco: Never  ?Vaping Use  ? Vaping Use: Never used  ?Substance and Sexual Activity  ? Alcohol use: Yes  ?  Alcohol/week: 0.0 standard drinks  ?  Comment: occasional beer  ? Drug use: No  ? Sexual activity: Not on file  ?Other Topics Concern  ? Not on file  ?Social History Narrative  ? Has living will  ? Daughters should be health care POA  ? Would accept resuscitation but no prolonged  ventilation  ? No tube feeds if cognitively unaware  ? ?Social Determinants of Health  ? ?Financial Resource Strain: Not on file  ?Food Insecurity: Not on file  ?Transportation Needs: Not on file  ?Physical Activity: Not on file  ?Stress: Not on file  ?Social Connections: Not on file  ?  ? ?Family History: ?The patient's family history includes Coronary artery disease in his mother; Heart disease in his brother; Heart disease (age of onset: 35) in his father; Heart failure in his mother; Hypertension in his mother; Stroke in his mother. There is no history of Cancer, Colon cancer, Rectal cancer, Stomach cancer, or Esophageal cancer. ? ?ROS:   ?Please see the history of present illness.    ?No fevers chills nausea vomiting syncope all other systems reviewed and are negative. ? ?EKGs/Labs/Other Studies Reviewed:   ? ?The following studies were reviewed today: ? ?Dobutamine stress echo 2015 normal ? ?EKG:  EKG is  ordered today.  The ekg ordered today demonstrates sinus rhythm with occasional PVCs, sinus arrhythmia heart rate 59 bpm right bundle branch block ? ?Recent Labs: ?08/02/2021: ALT 17; BUN 17; Creatinine, Ser 0.88; Hemoglobin 14.4; Platelets 233.0; Potassium 5.1; Sodium 140  ?Recent Lipid Panel ?   ?Component Value Date/Time  ? CHOL 127 08/02/2021 1006  ? TRIG 93.0 08/02/2021 1006  ? HDL 54.70 08/02/2021 1006  ? CHOLHDL 2 08/02/2021 1006  ? VLDL 18.6 08/02/2021 1006  ? [place] 53 08/02/2021 1006  ? ? ? ?Risk Assessment/Calculations:   ? ? ?    ? ?   ? ?Physical Exam:   ? ?VS:  BP 120/70 (BP Location: Left Arm, Patient Position: Sitting, Cuff Size: Normal)   Ht '5\' 10"'$  (1.778 m)   Wt 167 lb (75.8 kg)   SpO2 95%   BMI 23.96 kg/m?    ? ?Wt Readings from Last 3 Encounters:  ?12/18/21 167 lb (75.8 kg)  ?08/02/21 171 lb (77.6 kg)  ?01/26/21 169 lb (76.7 kg)  ?  ? ?GEN:  Well nourished, well developed in no acute distress ?HEENT: Normal ?NECK: No JVD; No carotid bruits ?LYMPHATICS: No lymphadenopathy ?CARDIAC: RRR,  no murmurs, no rubs, gallops occasional ectopy ?RESPIRATORY:  Clear to auscultation without rales, wheezing or rhonchi  ?ABDOMEN: Soft, non-tender, non-distended ?MUSCULOSKELETAL:  No edema; No deformity  ?SKIN: Warm and dry ?NEUROLOGIC:  Alert and oriented x 3 ?PSYCHIATRIC:  Normal affect  ? ?ASSESSMENT:   ? ?1. Coronary artery disease involving native  coronary artery of native heart without angina pectoris   ?2. Bilateral carotid artery stenosis   ?3. Primary hypertension   ?4. Mixed hyperlipidemia   ?5. Premature ventricular contractions   ? ?PLAN:   ? ?In order of problems listed above: ? ?CAD (coronary artery disease) ?Cypher stent placed to the circumflex in 2003.  Back in 2014, Dr. Ron Parker stopped his Plavix.  He has been on aspirin monotherapy since and has done quite well.  He had lengthy discussion with him at that time.   Prior 2015 stress test, dobutamine stress echo was normal.  Asymptomatic. ? ?Carotid artery disease ([place]) ?Mild carotid disease.  Continue with goal-directed medical therapy.  Atorvastatin 40 mg.  Aspirin 81. ? ?HTN (hypertension) ?Previously changed lisinopril 20 mg over to irbesartan 300 mg.  Also on amlodipine 5 mg daily.  Dr. Silvio Pate has been monitoring.  He showed me several tracings today.  Doing very well.  He is at goal. ? ?Hyperlipidemia ?On atorvastatin 40 mg once a day.  LDL goal less than 70, currently 53.  Last check.  No myalgias.  Doing well with current medication management we will continue to prescribe. ? ?Premature ventricular contractions ?Previously noted.  With frequent PVCs or PACs, this can cause blood pressure monitor to think there is underlying bradycardia especially if there is a bigeminy pattern. ?  ? ? ?  ? ? ?Medication Adjustments/Labs and Tests Ordered: ?Current medicines are reviewed at length with the patient today.  Concerns regarding medicines are outlined above.  ?Orders Placed This Encounter  ?Procedures  ? EKG 12-Lead  ? ?No orders of the defined types  were placed in this encounter. ? ? ?Patient Instructions  ?Medication Instructions:  ?The current medical regimen is effective;  continue present plan and medications. ? ?*If you need a refill on your car

## 2021-12-18 NOTE — Assessment & Plan Note (Signed)
Previously noted.  With frequent PVCs or PACs, this can cause blood pressure monitor to think there is underlying bradycardia especially if there is a bigeminy pattern. ?

## 2021-12-18 NOTE — Patient Instructions (Signed)
Medication Instructions:  ?The current medical regimen is effective;  continue present plan and medications. ? ?*If you need a refill on your cardiac medications before your next appointment, please call your pharmacy* ? ?Follow-Up: ?At [facility], you and your health needs are our priority.  As part of our continuing mission to provide you with exceptional heart care, we have created designated Provider Care Teams.  These Care Teams include your primary Cardiologist (physician) and Advanced Practice Providers (APPs -  Physician Assistants and Nurse Practitioners) who all work together to provide you with the care you need, when you need it. ? ?We recommend signing up for the patient portal called "MyChart".  Sign up information is provided on this After Visit Summary.  MyChart is used to connect with patients for Virtual Visits (Telemedicine).  Patients are able to view lab/test results, encounter notes, upcoming appointments, etc.  Non-urgent messages can be sent to your provider as well.   ?To learn more about what you can do with MyChart, go to [email or web address].   ? ?Your next appointment:   ?1 year(s) ? ?The format for your next appointment:   ?In Person ? ?Provider:   ?[name], MD   ? ? ?Thank you for choosing [facility]!! ? ? ? ? ?Important Information About Sugar ? ? ? ? ?  ?

## 2021-12-18 NOTE — Assessment & Plan Note (Addendum)
Cypher stent placed to the circumflex in 2003.  Back in 2014, Dr. Ron Parker stopped his Plavix.  He has been on aspirin monotherapy since and has done quite well.  He had lengthy discussion with him at that time.   Prior 2015 stress test, dobutamine stress echo was normal.  Asymptomatic. ?

## 2021-12-30 ENCOUNTER — Other Ambulatory Visit: Payer: Self-pay | Admitting: Cardiology

## 2021-12-30 DIAGNOSIS — E782 Mixed hyperlipidemia: Secondary | ICD-10-CM

## 2022-01-19 ENCOUNTER — Other Ambulatory Visit: Payer: Self-pay | Admitting: Cardiology

## 2022-02-10 ENCOUNTER — Other Ambulatory Visit: Payer: Self-pay | Admitting: Cardiology

## 2022-03-04 ENCOUNTER — Telehealth: Payer: Medicare Other | Admitting: Family Medicine

## 2022-03-04 DIAGNOSIS — S86919A Strain of unspecified muscle(s) and tendon(s) at lower leg level, unspecified leg, initial encounter: Secondary | ICD-10-CM | POA: Diagnosis not present

## 2022-03-04 NOTE — Progress Notes (Signed)
Virtual Visit Consent   [name], you are scheduled for a virtual visit with a Portal provider today. Just as with appointments in the office, your consent must be obtained to participate. Your consent will be active for this visit and any virtual visit you may have with one of our providers in the next 365 days. If you have a MyChart account, a copy of this consent can be sent to you electronically.  As this is a virtual visit, video technology does not allow for your provider to perform a traditional examination. This may limit your provider's ability to fully assess your condition. If your provider identifies any concerns that need to be evaluated in person or the need to arrange testing (such as labs, EKG, etc.), we will make arrangements to do so. Although advances in technology are sophisticated, we cannot ensure that it will always work on either your end or our end. If the connection with a video visit is poor, the visit may have to be switched to a telephone visit. With either a video or telephone visit, we are not always able to ensure that we have a secure connection.  By engaging in this virtual visit, you consent to the provision of healthcare and authorize for your insurance to be billed (if applicable) for the services provided during this visit. Depending on your insurance coverage, you may receive a charge related to this service.  I need to obtain your verbal consent now. Are you willing to proceed with your visit today? [name] has provided verbal consent on 03/04/2022 for a virtual visit (video or telephone). Dellia Nims, FNP  Date: 03/04/2022 5:17 PM  Virtual Visit via Video Note   I, Dellia Nims, connected with  [name]  ([id number], 06/01/1956) on 03/04/22 at  4:30 PM EDT by a video-enabled telemedicine application and verified that I am speaking with the correct person using two identifiers.  Location: Patient: Virtual Visit Location Patient:  Home Provider: Virtual Visit Location Provider: Home Office   I discussed the limitations of evaluation and management by telemedicine and the availability of in person appointments. The patient expressed understanding and agreed to proceed.    History of Present Illness: [name] is a 66 y.o. who identifies as a male who was assigned male at birth, and is being seen today for concerns for dvt rt leg. He strained rt knee at the gym 1.5 weeks ago. He now has swelling in rt knee and sometimes foot. Denies calf pain, redness and warmth. Denies chronic heath problems and risk factors such as travel and surgery. Marland Kitchen  HPI: HPI  Problems:  Patient Active Problem List   Diagnosis Date Noted   Premature ventricular contractions 12/18/2021   Advance directive discussed with patient 07/10/2019   Elevated fasting glucose 06/28/2017   BPH with obstruction/lower urinary tract symptoms 06/28/2017   Carotid artery disease ([place]) 12/24/2012   CAD (coronary artery disease)    Hyperlipidemia    HTN (hypertension)    GERD (gastroesophageal reflux disease)    Zenker's diverticulum    Bradycardia    Ejection fraction    Mitral regurgitation    Routine general medical examination at a health care facility 12/05/2010   COLONIC POLYPS, ADENOMATOUS, HX OF 08/11/2010   ALLERGIC RHINITIS 11/28/2007    Allergies:  Allergies  Allergen Reactions   Bee Venom Anaphylaxis    REACTION: anaphylaxis   Medications:  Current Outpatient Medications:    amLODipine (NORVASC) 5  MG tablet, Take 1 tablet by mouth once daily, Disp: 90 tablet, Rfl: 3   aspirin 81 MG tablet, Take 81 mg by mouth daily., Disp: , Rfl:    atorvastatin (LIPITOR) 40 MG tablet, TAKE 1 TABLET BY MOUTH ONCE DAILY AT  6PM, Disp: 90 tablet, Rfl: 3   cetirizine (ZYRTEC) 10 MG tablet, Take 10 mg by mouth daily., Disp: , Rfl:    cholecalciferol (VITAMIN D3) 25 MCG (1000 UNIT) tablet, Take 1,000 Units by mouth daily., Disp: , Rfl:    Coenzyme Q10  (CO Q 10) 100 MG CAPS, Take 1 tablet by mouth daily., Disp: , Rfl:    famotidine (PEPCID) 20 MG tablet, Take 20 mg by mouth daily., Disp: , Rfl:    irbesartan (AVAPRO) 300 MG tablet, Take 1 tablet (300 mg total) by mouth daily., Disp: 90 tablet, Rfl: 3   Multiple Vitamin (MULTI VITAMIN DAILY PO), Take 1 tablet by mouth daily., Disp: , Rfl:    nitroGLYCERIN (NITROSTAT) 0.4 MG SL tablet, DISSOLVE ONE TABLET UNDER THE TONGUE EVERY 5 MINUTES AS NEEDED FOR CHEST PAIN.  DO NOT EXCEED A TOTAL OF 3 DOSES IN 15 MINUTES, Disp: 25 tablet, Rfl: 11   Omega-3 Fatty Acids (ULTRA OMEGA-3 FISH OIL) 1400 MG CAPS, Take 1 capsule by mouth daily., Disp: , Rfl:   Observations/Objective: Patient is well-developed, well-nourished in no acute distress.  Resting comfortably  at home.  Head is normocephalic, atraumatic.  No labored breathing.  Speech is clear and coherent with logical content.  Patient is alert and oriented at baseline.    Assessment and Plan: 1. Strain of knee, unspecified laterality, initial encounter  Discussed symptoms. Low probability of a DVT however swelling continues and he plans to go to ortho urgent care this evening for eval.   Follow Up Instructions: I discussed the assessment and treatment plan with the patient. The patient was provided an opportunity to ask questions and all were answered. The patient agreed with the plan and demonstrated an understanding of the instructions.  A copy of instructions were sent to the patient via MyChart unless otherwise noted below.     The patient was advised to call back or seek an in-person evaluation if the symptoms worsen or if the condition fails to improve as anticipated.  Time:  I spent 10 minutes with the patient via telehealth technology discussing the above problems/concerns.    Dellia Nims, FNP

## 2022-03-06 ENCOUNTER — Encounter: Payer: Self-pay | Admitting: Cardiology

## 2022-05-11 ENCOUNTER — Encounter: Payer: Self-pay | Admitting: Internal Medicine

## 2022-08-03 ENCOUNTER — Ambulatory Visit (INDEPENDENT_AMBULATORY_CARE_PROVIDER_SITE_OTHER): Payer: Medicare Other | Admitting: Internal Medicine

## 2022-08-03 ENCOUNTER — Encounter: Payer: Self-pay | Admitting: Internal Medicine

## 2022-08-03 VITALS — BP 122/74 | HR 55 | Temp 97.4°F | Ht 70.0 in | Wt 171.0 lb

## 2022-08-03 DIAGNOSIS — Z0000 Encounter for general adult medical examination without abnormal findings: Secondary | ICD-10-CM

## 2022-08-03 DIAGNOSIS — I2510 Atherosclerotic heart disease of native coronary artery without angina pectoris: Secondary | ICD-10-CM | POA: Diagnosis not present

## 2022-08-03 DIAGNOSIS — K219 Gastro-esophageal reflux disease without esophagitis: Secondary | ICD-10-CM | POA: Diagnosis not present

## 2022-08-03 DIAGNOSIS — Z125 Encounter for screening for malignant neoplasm of prostate: Secondary | ICD-10-CM | POA: Diagnosis not present

## 2022-08-03 DIAGNOSIS — N138 Other obstructive and reflux uropathy: Secondary | ICD-10-CM

## 2022-08-03 DIAGNOSIS — I10 Essential (primary) hypertension: Secondary | ICD-10-CM | POA: Diagnosis not present

## 2022-08-03 DIAGNOSIS — N401 Enlarged prostate with lower urinary tract symptoms: Secondary | ICD-10-CM | POA: Diagnosis not present

## 2022-08-03 LAB — COMPREHENSIVE METABOLIC PANEL
ALT: 20 U/L (ref 0–53)
AST: 20 U/L (ref 0–37)
Albumin: 4.8 g/dL (ref 3.5–5.2)
Alkaline Phosphatase: 71 U/L (ref 39–117)
BUN: 18 mg/dL (ref 6–23)
CO2: 29 mEq/L (ref 19–32)
Calcium: 10.1 mg/dL (ref 8.4–10.5)
Chloride: 102 mEq/L (ref 96–112)
Creatinine, Ser: 0.86 mg/dL (ref 0.40–1.50)
GFR: 89.25 mL/min (ref >60.00)
Glucose, Bld: 103 mg/dL — ABNORMAL HIGH (ref 70–99)
Potassium: 4.7 mEq/L (ref 3.5–5.1)
Sodium: 139 mEq/L (ref 135–145)
Total Bilirubin: 0.8 mg/dL (ref 0.2–1.2)
Total Protein: 7.2 g/dL (ref 6.0–8.3)

## 2022-08-03 LAB — CBC
HCT: 46.8 % (ref 39.0–52.0)
Hemoglobin: 15.9 g/dL (ref 13.0–17.0)
MCHC: 34 g/dL (ref 30.0–36.0)
MCV: 88.9 fl (ref 78.0–100.0)
Platelets: 261 10*3/uL (ref 150.0–400.0)
RBC: 5.26 Mil/uL (ref 4.22–5.81)
RDW: 13.3 % (ref 11.5–15.5)
WBC: 7 10*3/uL (ref 4.0–10.5)

## 2022-08-03 LAB — LIPID PANEL
Cholesterol: 162 mg/dL (ref 0–200)
HDL: 53.1 mg/dL (ref >39.00)
LDL Cholesterol: 82 mg/dL (ref 0–99)
NonHDL: 108.84
Total CHOL/HDL Ratio: 3
Triglycerides: 135 mg/dL (ref 0.0–149.0)
VLDL: 27 mg/dL (ref 0.0–40.0)

## 2022-08-03 LAB — TSH: TSH: 1.29 u[IU]/mL (ref 0.35–5.50)

## 2022-08-03 LAB — PSA, MEDICARE: PSA: 1.84 ng/ml (ref 0.10–4.00)

## 2022-08-03 NOTE — Assessment & Plan Note (Signed)
BP Readings from Last 3 Encounters:  08/03/22 122/74  12/18/21 120/70  08/02/21 (!) 150/90   Good control on heart meds and amlodipine '5mg'$ 

## 2022-08-03 NOTE — Assessment & Plan Note (Signed)
Quiet on pepcid '20mg'$  daily

## 2022-08-03 NOTE — Progress Notes (Signed)
Subjective:    Patient ID: [name], male    DOB: 05-21-1956, 66 y.o.   MRN: [id number]  HPI Here for Medicare wellness visit and follow up of chronic health conditions Reviewed advanced directives Reviewed other doctors---[facility], [place] [facility] (derm), Dr Marta Lamas, Dr Delrae Rend, Dr Skains--cardiology No hospitalizations or surgery this year (except skin lesion) Vision is okay Hearing fine No alcohol or tobacco Exercises regularly No falls No depression or anhedonia Independent with instrumental ADLs No memory problems  Notes trouble with energy--not serious Lots of exercise---gym routinely (cardio/weights) No change in exercise tolerance Sleeps 8 or more hours--but still might nap. No restriction in activiities  No chest pain or SOB No palpitations  No dizziness or syncope No edema---just slight swelling right ankle at end of day  Takes pepcid daily No heartburn or dysphagia  Voids okay---better than he was Stream is better.Empties okay Nocturia x 1-3 times  Current Outpatient Medications on File Prior to Visit  Medication Sig Dispense Refill   amLODipine (NORVASC) 5 MG tablet Take 1 tablet by mouth once daily 90 tablet 3   aspirin 81 MG tablet Take 81 mg by mouth daily.     atorvastatin (LIPITOR) 40 MG tablet TAKE 1 TABLET BY MOUTH ONCE DAILY AT  6PM 90 tablet 3   cetirizine (ZYRTEC) 10 MG tablet Take 10 mg by mouth daily.     cholecalciferol (VITAMIN D3) 25 MCG (1000 UNIT) tablet Take 1,000 Units by mouth daily.     Coenzyme Q10 (CO Q 10) 100 MG CAPS Take 1 tablet by mouth daily.     famotidine (PEPCID) 20 MG tablet Take 20 mg by mouth daily.     irbesartan (AVAPRO) 300 MG tablet Take 1 tablet (300 mg total) by mouth daily. 90 tablet 3   Multiple Vitamin (MULTI VITAMIN DAILY PO) Take 1 tablet by mouth daily.     Omega-3 Fatty Acids (ULTRA OMEGA-3 FISH OIL) 1400 MG CAPS Take 1 capsule by mouth daily.     No current facility-administered  medications on file prior to visit.    Allergies  Allergen Reactions   Bee Venom Anaphylaxis    REACTION: anaphylaxis    Past Medical History:  Diagnosis Date   Allergic rhinitis    Allergy    Bradycardia    Relative bradycardia related to PACs and PVCs when measuring heart rate, benign, effects ability to measure blood pressure   CAD (coronary artery disease)    Cypher Stent RCA and circumflex 2003( long-term Plavix and aspirin) / nuclear 2008, no ischemia, couplets and triplets in recovery   Carotid artery disease ([place])    Doppler, and November, 2011, normal carotid arteries, patent vertebrals   COPD (chronic obstructive pulmonary disease) ([place])    hx smoker, no inhalers   Dyslipidemia    Ejection fraction 2015   EF 55-60%, echo, stress test normal   GERD (gastroesophageal reflux disease)    Hemorrhoids    hx   Hiatal hernia    History of colonic polyps    HTN (hypertension)    Hyperlipidemia    Mitral regurgitation    Mild, echo, 2008   Zenker's diverticulum    Bleed and surgical repair    Past Surgical History:  Procedure Laterality Date   COLONOSCOPY  2006, 2012, 2017   polyps   CORONARY ANGIOPLASTY WITH STENT PLACEMENT  02/2002   x2   LEFT WRIST  04-2009   RIGHT SHOULDER  04-2009   ZENKER'S DIVERTICULECTOMY ENDOSCOPIC  08-2005   repeat in 2012    Family History  Problem Relation Age of Onset   Heart disease Father 4       MVA   Hypertension Mother    Coronary artery disease Mother    Heart failure Mother    Stroke Mother    Heart disease Brother        CABG   Cancer Neg Hx        no prostate or colon cancer   Colon cancer Neg Hx    Rectal cancer Neg Hx    Stomach cancer Neg Hx    Esophageal cancer Neg Hx     Social History   Socioeconomic History   Marital status: Divorced    Spouse name: Not on file   Number of children: 2   Years of education: Not on file   Highest education level: Not on file  Occupational History   Occupation:  Technician [employer]    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  Tobacco Use   Smoking status: Former    Packs/day: 3.00    Years: 35.00    Total pack years: 105.00    Types: Cigarettes    Quit date: 06/15/2006    Years since quitting: 16.1    Passive exposure: Past   Smokeless tobacco: Never  Vaping Use   Vaping Use: Never used  Substance and Sexual Activity   Alcohol use: Yes    Alcohol/week: 0.0 standard drinks of alcohol    Comment: occasional beer   Drug use: No   Sexual activity: Not on file  Other Topics Concern   Not on file  Social History Narrative   Has living will   Daughters should be health care POA   Would accept resuscitation but no prolonged ventilation   No tube feeds if cognitively unaware   Social Determinants of Health   Financial Resource Strain: Not on file  Food Insecurity: Not on file  Transportation Needs: Not on file  Physical Activity: Not on file  Stress: Not on file  Social Connections: Not on file  Intimate Partner Violence: Not on file   Review of Systems Sleeps okay Appetite is good Weight is slightly down Wears seat belt Teeth are fine--keeps up with dentist No suspicious skin lesions Bowels are fine--no blood No sig back or joint pains    Objective:   Physical Exam Constitutional:      Appearance: Normal appearance.  HENT:     Mouth/Throat:     Comments: No lesions Eyes:     Conjunctiva/sclera: Conjunctivae normal.     Pupils: Pupils are equal, round, and reactive to light.  Cardiovascular:     Rate and Rhythm: Normal rate and regular rhythm.     Pulses: Normal pulses.     Heart sounds:     No gallop.     Comments: ?faint systolic murmur at the apex Pulmonary:     Effort: Pulmonary effort is normal.     Breath sounds: Normal breath sounds. No wheezing or rales.  Abdominal:     Palpations: Abdomen is soft.     Tenderness: There is no abdominal tenderness.  Musculoskeletal:     Cervical back: Neck supple.     Right lower leg: No edema.      Left lower leg: No edema.  Lymphadenopathy:     Cervical: No cervical adenopathy.  Skin:    Findings: No lesion or rash.  Neurological:     General: No focal deficit present.  Mental Status: He is alert and oriented to person, place, and time.     Comments: Mini-cog normal  Psychiatric:        Mood and Affect: Mood normal.        Behavior: Behavior normal.            Assessment & Plan:

## 2022-08-03 NOTE — Assessment & Plan Note (Signed)
No symptoms Discussed that he should call 911 if any chest pain--not use nitro On ASA 81, atorvastatin 40, irbesartan 300

## 2022-08-03 NOTE — Progress Notes (Signed)
Hearing Screening - Comments:: Passed whisper test Vision Screening - Comments:: May 2023

## 2022-08-03 NOTE — Assessment & Plan Note (Signed)
Symptoms still don't warrant Rx--would try tamsulosin 0.4 mg daily if worsens

## 2022-08-03 NOTE — Assessment & Plan Note (Signed)
I have personally reviewed the Medicare Annual Wellness questionnaire and have noted 1. The patient's medical and social history 2. Their use of alcohol, tobacco or illicit drugs 3. Their current medications and supplements 4. The patient's functional ability including ADL's, fall risks, home safety risks and hearing or visual             impairment. 5. Diet and physical activities 6. Evidence for depression or mood disorders  The patients weight, height, BMI and visual acuity have been recorded in the chart I have made referrals, counseling and provided education to the patient based review of the above and I have provided the pt with a written personalized care plan for preventive services.  I have provided you with a copy of your personalized plan for preventive services. Please take the time to review along with your updated medication list.  Colon due again 2029 Will check PSA again Works out hard Had flu vaccine Wishes to hold off on updated COVID vaccine

## 2022-08-06 ENCOUNTER — Encounter: Payer: Self-pay | Admitting: Internal Medicine

## 2022-12-27 NOTE — Progress Notes (Signed)
Cardiology Office Note:    Date:  12/28/2022  ID:  [name], DOB 1956-08-15, MRN [id number] PCP: Karie Schwalbe, MD  [facility] Providers Cardiologist:  Donato Schultz, MD          Patient Profile:   Coronary artery disease   NSTEMI in 02/2002 s/p 2.75 x 12 mm Express stent to RCA  LHC 03/11/2002: EF 50, LAD 30; pLCx 70-80, AV LCx 50; RCA prox 50, mid 100, dist 30-40 Staged PCI: s/p 2.4 x 18 mm Cypher DES to pLCx on 03/16/2002 Dobutamine Echocardiogram 08/12/14: Normal TTE 08/02/14: mild LVH, EF 60-65, no RWMA, Gr 1 DD, trivial AI, mild MR, mild TR, PASP 30 PACs/PVCs; relative bradycardia Right Bundle Branch Block  Hypertension  Hyperlipidemia  Aorta US 07/21/2019: no AAA; +aorto-iliac atherosclerosis Carotid artery disease Carotid US 12/31/14: Bilat ICA 1-39 Chronic Obstructive Pulmonary Disease         History of Present Illness:   [name] is a 67 y.o. male who returns for f/u of coronary artery disease. He was last seen by Dr. Anne Fu 12/18/21.  He is here alone.  He remains active.  He works out on a regular basis.  He does weight training as well as aerobic activity.  He has not had chest pain, shortness of breath, syncope, orthopnea, leg edema.  Review of Systems  HENT:  Positive for nosebleeds.   Cardiovascular:  Negative for claudication.   see HPI    Studies Reviewed:    EKG: NSR, HR 62, normal axis, RBBB, QTc 424, similar to prior tracings  Risk Assessment/Calculations:             Physical Exam:   VS:  BP 102/60   Pulse 62   Ht 5\' 10"  (1.778 m)   Wt 165 lb 6.4 oz (75 kg)   SpO2 95%   BMI 23.73 kg/m    Wt Readings from Last 3 Encounters:  12/28/22 165 lb 6.4 oz (75 kg)  08/03/22 171 lb (77.6 kg)  12/18/21 167 lb (75.8 kg)    Constitutional:      Appearance: Healthy appearance. Not in distress.  Neck:     Vascular: No carotid bruit. JVD normal.  Pulmonary:     Breath sounds: Normal breath sounds. No wheezing. No rales.   Cardiovascular:     Normal rate. Regular rhythm.     Murmurs: There is no murmur.  Pulses:    Dorsalis pedis: 2+ bilaterally.    Posterior tibial: 2+ bilaterally. Edema:    Peripheral edema absent.  Abdominal:     Palpations: Abdomen is soft.       ASSESSMENT AND PLAN:   CAD (coronary artery disease) History of non-STEMI in July 2023 treated a stent to the RCA and staged intervention with Cypher DES to the LCx.  Dobutamine echo in 2015 was normal.  He is very active without chest discomfort or shortness of breath to suggest angina.  Continue ASA 81 mg daily, Lipitor 40 mg daily, nitroglycerin as needed chest pain.  Follow-up 1 year.  Hyperlipidemia Labs from primary care reviewed.  Recent LDL above goal at 82.  Goal is at least less than 70, ideally less than 55.  His LDL has been at goal over the years.  Continue Lipitor 40 mg daily.  Continue to monitor lipids.  If LDL remains above 70, consider increasing Lipitor to 80 mg daily.  HTN (hypertension) Blood pressure well-controlled.  He brings in a detailed list of his  blood pressures over the last several weeks.  Most of his readings are optimal.  Continue amlodipine 5 mg daily, irbesartan 300 mg daily.  Labs from primary care reviewed.  Recent creatinine normal at 0.86.     Dispo:  Return in about 1 year (around 12/28/2023) for Routine Follow Up with Dr. Anne Fu.  Signed, Tereso Newcomer, PA-C

## 2022-12-28 ENCOUNTER — Ambulatory Visit: Payer: Medicare Other | Attending: Physician Assistant | Admitting: Physician Assistant

## 2022-12-28 ENCOUNTER — Encounter: Payer: Self-pay | Admitting: Physician Assistant

## 2022-12-28 VITALS — BP 102/60 | HR 62 | Ht 70.0 in | Wt 165.4 lb

## 2022-12-28 DIAGNOSIS — I2510 Atherosclerotic heart disease of native coronary artery without angina pectoris: Secondary | ICD-10-CM | POA: Diagnosis not present

## 2022-12-28 DIAGNOSIS — I10 Essential (primary) hypertension: Secondary | ICD-10-CM

## 2022-12-28 DIAGNOSIS — E782 Mixed hyperlipidemia: Secondary | ICD-10-CM

## 2022-12-28 MED ORDER — NITROGLYCERIN 0.4 MG SL SUBL: 0.4000 mg | SUBLINGUAL_TABLET | SUBLINGUAL | 3 refills | Status: DC | PRN

## 2022-12-28 MED ORDER — ATORVASTATIN CALCIUM 40 MG PO TABS: ORAL_TABLET | ORAL | 3 refills | Status: DC

## 2022-12-28 MED ORDER — AMLODIPINE BESYLATE 5 MG PO TABS: 5.0000 mg | ORAL_TABLET | Freq: Every day | ORAL | 3 refills | Status: DC

## 2022-12-28 MED ORDER — IRBESARTAN 300 MG PO TABS: 300.0000 mg | ORAL_TABLET | Freq: Every day | ORAL | 3 refills | Status: DC

## 2022-12-28 NOTE — Assessment & Plan Note (Signed)
Labs from primary care reviewed.  Recent LDL above goal at 82.  Goal is at least less than 70, ideally less than 55.  His LDL has been at goal over the years.  Continue Lipitor 40 mg daily.  Continue to monitor lipids.  If LDL remains above 70, consider increasing Lipitor to 80 mg daily.

## 2022-12-28 NOTE — Patient Instructions (Signed)
Medication Instructions:  Your physician recommends that you continue on your current medications as directed. Please refer to the Current Medication list given to you today.  *If you need a refill on your cardiac medications before your next appointment, please call your pharmacy*   Lab Work: None ordered If you have labs (blood work) drawn today and your tests are completely normal, you will receive your results only by: MyChart Message (if you have MyChart) OR A paper copy in the mail If you have any lab test that is abnormal or we need to change your treatment, we will call you to review the results.   Follow-Up: At [facility], you and your health needs are our priority.  As part of our continuing mission to provide you with exceptional heart care, we have created designated Provider Care Teams.  These Care Teams include your primary Cardiologist (physician) and Advanced Practice Providers (APPs -  Physician Assistants and Nurse Practitioners) who all work together to provide you with the care you need, when you need it.   Your next appointment:   1 year(s)  Provider:   Mark Skains, MD    

## 2022-12-28 NOTE — Assessment & Plan Note (Signed)
History of non-STEMI in July 2023 treated a stent to the RCA and staged intervention with Cypher DES to the LCx.  Dobutamine echo in 2015 was normal.  He is very active without chest discomfort or shortness of breath to suggest angina.  Continue ASA 81 mg daily, Lipitor 40 mg daily, nitroglycerin as needed chest pain.  Follow-up 1 year.

## 2022-12-28 NOTE — Assessment & Plan Note (Signed)
Blood pressure well-controlled.  He brings in a detailed list of his blood pressures over the last several weeks.  Most of his readings are optimal.  Continue amlodipine 5 mg daily, irbesartan 300 mg daily.  Labs from primary care reviewed.  Recent creatinine normal at 0.86.

## 2023-01-02 ENCOUNTER — Encounter: Payer: Self-pay | Admitting: Internal Medicine

## 2023-01-02 ENCOUNTER — Ambulatory Visit: Payer: Medicare Other | Admitting: Internal Medicine

## 2023-01-02 VITALS — BP 118/68 | HR 78 | Temp 97.8°F | Ht 70.0 in | Wt 164.0 lb

## 2023-01-02 DIAGNOSIS — J029 Acute pharyngitis, unspecified: Secondary | ICD-10-CM | POA: Insufficient documentation

## 2023-01-02 LAB — POCT RAPID STREP A (OFFICE): Rapid Strep A Screen: NEGATIVE

## 2023-01-02 MED ORDER — AMOXICILLIN 500 MG PO TABS: 1000.0000 mg | ORAL_TABLET | Freq: Two times a day (BID) | ORAL | 0 refills | Status: AC

## 2023-01-02 NOTE — Assessment & Plan Note (Signed)
Strep negative Now has developed more of a cough Still likely just viral Discussed cough suppressants (DM) and analgesics If worsens, will fill Rx for amoxil 1000 bid x 7 days (secondary sinus infection)

## 2023-01-02 NOTE — Progress Notes (Signed)
   Subjective:    Patient ID: [name], male    DOB: 06/25/1956, 67 y.o.   MRN: [id number]  HPI Here due to a respiratory infection  Started 4-5 days ago Sore throat that then worsened--some pain with swallowing Then started coughing 2 days ago---productive with gray green stuff He saw white spots on throat yesterday Burning in chest with cough  Didn't check temp--but had chills 3 nights ago No SOB No headache No ear pain No clear post nasal drip  Coricidin HBP---low dose (no clear help)  Review of Systems No N/V No loss or smell or taste No COVID test Eating okay     Objective:   Physical Exam Constitutional:      Appearance: Normal appearance.  HENT:     Head:     Comments: No sinus tenderness    Right Ear: Tympanic membrane and ear canal normal.     Left Ear: Tympanic membrane and ear canal normal.     Mouth/Throat:     Comments: Mild pharyngeal injection---with slight white spots that are not true exudates Pulmonary:     Effort: Pulmonary effort is normal.     Breath sounds: Normal breath sounds. No wheezing or rales.  Musculoskeletal:     Cervical back: Neck supple.  Lymphadenopathy:     Cervical: No cervical adenopathy.  Neurological:     Mental Status: He is alert.            Assessment & Plan:

## 2023-04-08 ENCOUNTER — Encounter: Payer: Self-pay | Admitting: Podiatry

## 2023-04-08 ENCOUNTER — Other Ambulatory Visit: Payer: Self-pay | Admitting: Podiatry

## 2023-04-08 ENCOUNTER — Ambulatory Visit (INDEPENDENT_AMBULATORY_CARE_PROVIDER_SITE_OTHER): Payer: Medicare Other

## 2023-04-08 ENCOUNTER — Ambulatory Visit: Payer: Medicare Other | Admitting: Podiatry

## 2023-04-08 DIAGNOSIS — M7750 Other enthesopathy of unspecified foot: Secondary | ICD-10-CM

## 2023-04-08 DIAGNOSIS — M778 Other enthesopathies, not elsewhere classified: Secondary | ICD-10-CM

## 2023-04-08 NOTE — Progress Notes (Signed)
Subjective:  Patient ID: [name], male    DOB: 1956-02-03,  MRN: [id number] HPI Chief Complaint  Patient presents with   Foot Pain    Dorsal/lateral and forefoot right - thinks may have injured foot couple years ago exercising, noticed swelling afterwards, now foot still doesn't feel quite right, no true pain, but wanting to be active   New Patient (Initial Visit)    67 y.o. male presents with the above complaint.   ROS: Denies fever chills nausea vomit muscle aches pains calf pain back pain chest pain shortness of breath.  Past Medical History:  Diagnosis Date   Allergic rhinitis    Allergy    Asthma    Bradycardia    Relative bradycardia related to PACs and PVCs when measuring heart rate, benign, effects ability to measure blood pressure   CAD (coronary artery disease)    Cypher Stent RCA and circumflex 2003( long-term Plavix and aspirin) / nuclear 2008, no ischemia, couplets and triplets in recovery   Carotid artery disease (HCC)    Doppler, and November, 2011, normal carotid arteries, patent vertebrals   COPD (chronic obstructive pulmonary disease) (HCC)    hx smoker, no inhalers   Dyslipidemia    Ejection fraction 2015   EF 55-60%, echo, stress test normal   GERD (gastroesophageal reflux disease)    Hemorrhoids    hx   Hiatal hernia    History of colonic polyps    HTN (hypertension)    Hyperlipidemia    Mitral regurgitation    Mild, echo, 2008   Zenker's diverticulum    Bleed and surgical repair   Past Surgical History:  Procedure Laterality Date   COLONOSCOPY  2006, 2012, 2017   polyps   CORONARY ANGIOPLASTY WITH STENT PLACEMENT  02/2002   x2   LEFT WRIST  04-2009   RIGHT SHOULDER  04-2009   ZENKER'S DIVERTICULECTOMY ENDOSCOPIC  08-2005   repeat in 2012    Current Outpatient Medications:    amLODipine (NORVASC) 5 MG tablet, Take 1 tablet (5 mg total) by mouth daily., Disp: 90 tablet, Rfl: 3   aspirin 81 MG tablet, Take 81 mg by mouth daily., Disp:  , Rfl:    atorvastatin (LIPITOR) 40 MG tablet, TAKE 1 TABLET BY MOUTH ONCE DAILY AT  6PM, Disp: 90 tablet, Rfl: 3   cetirizine (ZYRTEC) 10 MG tablet, Take 10 mg by mouth daily., Disp: , Rfl:    cholecalciferol (VITAMIN D3) 25 MCG (1000 UNIT) tablet, Take 1,000 Units by mouth daily., Disp: , Rfl:    Coenzyme Q10 (CO Q 10) 100 MG CAPS, Take 1 tablet by mouth daily., Disp: , Rfl:    famotidine (PEPCID) 20 MG tablet, Take 20 mg by mouth daily., Disp: , Rfl:    irbesartan (AVAPRO) 300 MG tablet, Take 1 tablet (300 mg total) by mouth daily., Disp: 90 tablet, Rfl: 3   Multiple Vitamin (MULTI VITAMIN DAILY PO), Take 1 tablet by mouth daily., Disp: , Rfl:    nitroGLYCERIN (NITROSTAT) 0.4 MG SL tablet, Place 1 tablet (0.4 mg total) under the tongue every 5 (five) minutes as needed for chest pain., Disp: 90 tablet, Rfl: 3   Omega-3 Fatty Acids (ULTRA OMEGA-3 FISH OIL) 1400 MG CAPS, Take 1 capsule by mouth daily., Disp: , Rfl:   Allergies  Allergen Reactions   Bee Venom Anaphylaxis    REACTION: anaphylaxis   Review of Systems Objective:  There were no vitals filed for this visit.  General: Well developed,  nourished, in no acute distress, alert and oriented x3   Dermatological: Skin is warm, dry and supple bilateral. Nails x 10 are well maintained; remaining integument appears unremarkable at this time. There are no open sores, no preulcerative lesions, no rash or signs of infection present.  Vascular: Dorsalis Pedis artery and Posterior Tibial artery pedal pulses are 2/4 bilateral with immedate capillary fill time. Pedal hair growth present. No varicosities and no lower extremity edema present bilateral.   Neruologic: Grossly intact via light touch bilateral. Vibratory intact via tuning fork bilateral. Protective threshold with Semmes Wienstein monofilament intact to all pedal sites bilateral. Patellar and Achilles deep tendon reflexes 2+ bilateral. No Babinski or clonus noted bilateral.    Musculoskeletal: No gross boney pedal deformities bilateral. No pain, crepitus, or limitation noted with foot and ankle range of motion bilateral. Muscular strength 5/5 in all groups tested bilateral.  He has some edema on the dorsal aspect of the right foot at the metatarsal phalangeal joints with some tenderness on range of motion particularly of the third metatarsal phalangeal joint.  No pain on direct palpation of the metatarsals themselves.  He has some discomfort in the proximal fourth intermetatarsal space the majority of his concern lies with the popping sensation around the fourth fifth tarsometatarsal joint.  Gait: Unassisted, Nonantalgic.    Radiographs:  Radiographs taken today demonstrate an osseously mature individual with good bone mineralization does not demonstrate any significant osseous abnormalities in the area there is some soft tissue swelling is noted dorsally from lateral view.  Assessment & Plan:   Assessment: Capsulitis dorsal aspect right foot.  Cannot rule out a dislocating or subluxating cuboid.  Plan: Discussed etiology pathology conservative surgical therapies discussed appropriate shoe gear stretching exercise ice therapy sugar modifications discussed cutting back on his activities at the gym and cycling.  I will follow-up with him should this worsen.  An MRI would most likely be necessary at that point      T. Woodson, [place]

## 2023-04-09 ENCOUNTER — Encounter: Payer: Self-pay | Admitting: Podiatry

## 2023-04-11 ENCOUNTER — Ambulatory Visit (INDEPENDENT_AMBULATORY_CARE_PROVIDER_SITE_OTHER): Payer: Medicare Other

## 2023-04-11 DIAGNOSIS — M7751 Other enthesopathy of right foot: Secondary | ICD-10-CM

## 2023-04-11 DIAGNOSIS — M778 Other enthesopathies, not elsewhere classified: Secondary | ICD-10-CM

## 2023-04-11 DIAGNOSIS — M7750 Other enthesopathy of unspecified foot: Secondary | ICD-10-CM

## 2023-04-11 NOTE — Progress Notes (Signed)
Orthotic eval   Patient was seen, measured for custom molded foot orthotics  Patient will benefit from CFO's as they will help provide total contact to MLA's helping to better distribute body weight across BIL feet greater reducing plantar pressure and pain and to also encourage FF and RF alignment  Patient was scanned items to be ordered and fit when in

## 2023-05-17 ENCOUNTER — Ambulatory Visit (INDEPENDENT_AMBULATORY_CARE_PROVIDER_SITE_OTHER): Payer: Medicare Other

## 2023-05-17 NOTE — Progress Notes (Signed)
Patient presents today to pick up custom molded foot orthotics, diagnosed with Capsulitis and tendonitis by Dr. Al Corpus.   Orthotics were dispensed and fit was satisfactory. Reviewed instructions for break-in and wear. Written instructions given to patient.  Patient will follow up as needed.   [name] Cped, CFo, CFm

## 2023-05-18 ENCOUNTER — Encounter: Payer: Self-pay | Admitting: Internal Medicine

## 2023-08-06 ENCOUNTER — Ambulatory Visit: Payer: Medicare Other | Admitting: Internal Medicine

## 2023-08-06 ENCOUNTER — Encounter: Payer: Self-pay | Admitting: Internal Medicine

## 2023-08-06 VITALS — BP 130/84 | HR 60 | Temp 98.4°F | Ht 70.0 in | Wt 170.0 lb

## 2023-08-06 DIAGNOSIS — I10 Essential (primary) hypertension: Secondary | ICD-10-CM

## 2023-08-06 DIAGNOSIS — N401 Enlarged prostate with lower urinary tract symptoms: Secondary | ICD-10-CM

## 2023-08-06 DIAGNOSIS — Z0000 Encounter for general adult medical examination without abnormal findings: Secondary | ICD-10-CM | POA: Diagnosis not present

## 2023-08-06 DIAGNOSIS — N138 Other obstructive and reflux uropathy: Secondary | ICD-10-CM

## 2023-08-06 DIAGNOSIS — Z125 Encounter for screening for malignant neoplasm of prostate: Secondary | ICD-10-CM | POA: Diagnosis not present

## 2023-08-06 DIAGNOSIS — K219 Gastro-esophageal reflux disease without esophagitis: Secondary | ICD-10-CM | POA: Diagnosis not present

## 2023-08-06 DIAGNOSIS — I2510 Atherosclerotic heart disease of native coronary artery without angina pectoris: Secondary | ICD-10-CM | POA: Diagnosis not present

## 2023-08-06 DIAGNOSIS — I779 Disorder of arteries and arterioles, unspecified: Secondary | ICD-10-CM | POA: Diagnosis not present

## 2023-08-06 LAB — CBC
HCT: 46 % (ref 39.0–52.0)
Hemoglobin: 15.2 g/dL (ref 13.0–17.0)
MCHC: 33 g/dL (ref 30.0–36.0)
MCV: 91 fL (ref 78.0–100.0)
Platelets: 269 10*3/uL (ref 150.0–400.0)
RBC: 5.06 Mil/uL (ref 4.22–5.81)
RDW: 13.4 % (ref 11.5–15.5)
WBC: 5.7 10*3/uL (ref 4.0–10.5)

## 2023-08-06 LAB — COMPREHENSIVE METABOLIC PANEL
ALT: 21 U/L (ref 0–53)
AST: 23 U/L (ref 0–37)
Albumin: 4.6 g/dL (ref 3.5–5.2)
Alkaline Phosphatase: 66 U/L (ref 39–117)
BUN: 23 mg/dL (ref 6–23)
CO2: 27 meq/L (ref 19–32)
Calcium: 9.6 mg/dL (ref 8.4–10.5)
Chloride: 102 meq/L (ref 96–112)
Creatinine, Ser: 0.97 mg/dL (ref 0.40–1.50)
GFR: 79.9 mL/min (ref >60.00)
Glucose, Bld: 107 mg/dL — ABNORMAL HIGH (ref 70–99)
Potassium: 4.5 meq/L (ref 3.5–5.1)
Sodium: 139 meq/L (ref 135–145)
Total Bilirubin: 0.8 mg/dL (ref 0.2–1.2)
Total Protein: 6.8 g/dL (ref 6.0–8.3)

## 2023-08-06 LAB — LIPID PANEL
Cholesterol: 152 mg/dL (ref 0–200)
HDL: 59.7 mg/dL (ref >39.00)
LDL Cholesterol: 72 mg/dL (ref 0–99)
NonHDL: 92.48
Total CHOL/HDL Ratio: 3
Triglycerides: 102 mg/dL (ref 0.0–149.0)
VLDL: 20.4 mg/dL (ref 0.0–40.0)

## 2023-08-06 LAB — PSA, MEDICARE: PSA: 1.9 ng/mL (ref 0.10–4.00)

## 2023-08-06 MED ORDER — TAMSULOSIN HCL 0.4 MG PO CAPS: 0.4000 mg | ORAL_CAPSULE | Freq: Every day | ORAL | 3 refills | Status: DC

## 2023-08-06 NOTE — Progress Notes (Signed)
Subjective:    Patient ID: [name], male    DOB: 1956-08-04, 67 y.o.   MRN: [id number]  HPI Here for Medicare wellness visit and follow up of chronic health conditions Reviewed advanced directives Reviewed other doctors---Dr Hyatt--podiatry, Dr Forest Becker Weaver--cardiology, Ms Gillermo Murdoch, Dr Haskel Schroeder, [facility], Dr Brennan Bailey No hospitalizations or surgery in the past year Still exercises regularly Vision is fine Hearing is good Rare alcohol No tobacco No falls No depression or anhedonia Independent with instrumental ADLs No sig memory issues  No chest pain or SOB No dizziness or syncope No palpitations No edema No headaches Low level carotid stenosis--not checked in a while---but no neurologic symptoms  Taking famotidine daily at bedtime Controls symptoms No dysphagia  Takes the cetirizine daily  Voids with slow stream Nocturia is variable--as high as 3 Does seem to empty okay  Current Outpatient Medications on File Prior to Visit  Medication Sig Dispense Refill   amLODipine (NORVASC) 5 MG tablet Take 1 tablet (5 mg total) by mouth daily. 90 tablet 3   aspirin 81 MG tablet Take 81 mg by mouth daily.     atorvastatin (LIPITOR) 40 MG tablet TAKE 1 TABLET BY MOUTH ONCE DAILY AT  6PM 90 tablet 3   cetirizine (ZYRTEC) 10 MG tablet Take 10 mg by mouth daily.     cholecalciferol (VITAMIN D3) 25 MCG (1000 UNIT) tablet Take 1,000 Units by mouth daily.     Coenzyme Q10 (CO Q 10) 100 MG CAPS Take 1 tablet by mouth daily.     CREATINE PO Take by mouth.     famotidine (PEPCID) 20 MG tablet Take 20 mg by mouth daily.     irbesartan (AVAPRO) 300 MG tablet Take 1 tablet (300 mg total) by mouth daily. 90 tablet 3   Multiple Vitamin (MULTI VITAMIN DAILY PO) Take 1 tablet by mouth daily.     Omega-3 Fatty Acids (ULTRA OMEGA-3 FISH OIL) 1400 MG CAPS Take 1 capsule by mouth daily.     TURMERIC PO Take by mouth.     nitroGLYCERIN (NITROSTAT) 0.4 MG SL tablet  Place 1 tablet (0.4 mg total) under the tongue every 5 (five) minutes as needed for chest pain. 90 tablet 3   No current facility-administered medications on file prior to visit.    Allergies  Allergen Reactions   Bee Venom Anaphylaxis    REACTION: anaphylaxis    Past Medical History:  Diagnosis Date   Allergic rhinitis    Allergy    Asthma    Bradycardia    Relative bradycardia related to PACs and PVCs when measuring heart rate, benign, effects ability to measure blood pressure   CAD (coronary artery disease)    Cypher Stent RCA and circumflex 2003( long-term Plavix and aspirin) / nuclear 2008, no ischemia, couplets and triplets in recovery   Carotid artery disease (HCC)    Doppler, and November, 2011, normal carotid arteries, patent vertebrals   COPD (chronic obstructive pulmonary disease) (HCC)    hx smoker, no inhalers   Dyslipidemia    Ejection fraction 2015   EF 55-60%, echo, stress test normal   GERD (gastroesophageal reflux disease)    Hemorrhoids    hx   Hiatal hernia    History of colonic polyps    HTN (hypertension)    Hyperlipidemia    Mitral regurgitation    Mild, echo, 2008   Zenker's diverticulum    Bleed and surgical repair    Past Surgical History:  Procedure  Laterality Date   COLONOSCOPY  2006, 2012, 2017   polyps   CORONARY ANGIOPLASTY WITH STENT PLACEMENT  02/2002   x2   LEFT WRIST  04-2009   RIGHT SHOULDER  04-2009   ZENKER'S DIVERTICULECTOMY ENDOSCOPIC  08-2005   repeat in 2012    Family History  Problem Relation Age of Onset   Heart disease Father 77       MVA   Hypertension Mother    Coronary artery disease Mother    Heart failure Mother    Stroke Mother    Heart disease Brother        CABG   Cancer Neg Hx        no prostate or colon cancer   Colon cancer Neg Hx    Rectal cancer Neg Hx    Stomach cancer Neg Hx    Esophageal cancer Neg Hx     Social History   Socioeconomic History   Marital status: Divorced    Spouse name:  Not on file   Number of children: 2   Years of education: Not on file   Highest education level: Not on file  Occupational History   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  Tobacco Use   Smoking status: Former    Current packs/day: 0.00    Average packs/day: 3.0 packs/day for 35.0 years (105.0 ttl pk-yrs)    Types: Cigarettes    Start date: 06/16/1971    Quit date: 06/15/2006    Years since quitting: 17.1    Passive exposure: Past   Smokeless tobacco: Never  Vaping Use   Vaping status: Never Used  Substance and Sexual Activity   Alcohol use: Yes    Alcohol/week: 0.0 standard drinks of alcohol    Comment: occasional beer   Drug use: No   Sexual activity: Not on file  Other Topics Concern   Not on file  Social History Narrative   Has living will   Daughters should be health care POA   Would accept resuscitation but no prolonged ventilation   No tube feeds if cognitively unaware   Social Determinants of Health   Financial Resource Strain: Not on file  Food Insecurity: Not on file  Transportation Needs: Not on file  Physical Activity: Not on file  Stress: Not on file  Social Connections: Not on file  Intimate Partner Violence: Not on file   Review of Systems Appetite is good Weight is stable Sleeps well Wears seat belt [place] bridge in mouth--doing okay overall Bowels move fine--no blood  Has spot on left cheek he needs checked--does see derm (benign white papule) No sig back or joint pains--just some stiffness    Objective:   Physical Exam Constitutional:      Appearance: Normal appearance.  HENT:     Mouth/Throat:     Pharynx: No oropharyngeal exudate or posterior oropharyngeal erythema.  Eyes:     Conjunctiva/sclera: Conjunctivae normal.     Pupils: Pupils are equal, round, and reactive to light.  Cardiovascular:     Rate and Rhythm: Normal rate and regular rhythm.     Pulses: Normal pulses.     Heart sounds: No murmur heard.    No gallop.   Pulmonary:     Effort: Pulmonary effort is normal.     Breath sounds: Normal breath sounds. No wheezing or rales.  Abdominal:     Palpations: Abdomen is soft.     Tenderness: There is no abdominal tenderness.  Musculoskeletal:     Cervical back: Neck supple.     Right lower leg: No edema.     Left lower leg: No edema.  Lymphadenopathy:     Cervical: No cervical adenopathy.  Skin:    Findings: No lesion or rash.  Neurological:     General: No focal deficit present.     Mental Status: He is alert and oriented to person, place, and time.  Psychiatric:        Mood and Affect: Mood normal.        Behavior: Behavior normal.            Assessment & Plan:

## 2023-08-06 NOTE — Assessment & Plan Note (Signed)
I have personally reviewed the Medicare Annual Wellness questionnaire and have noted 1. The patient's medical and social history 2. Their use of alcohol, tobacco or illicit drugs 3. Their current medications and supplements 4. The patient's functional ability including ADL's, fall risks, home safety risks and hearing or visual             impairment. 5. Diet and physical activities 6. Evidence for depression or mood disorders  The patients weight, height, BMI and visual acuity have been recorded in the chart I have made referrals, counseling and provided education to the patient based review of the above and I have provided the pt with a written personalized care plan for preventive services.  I have provided you with a copy of your personalized plan for preventive services. Please take the time to review along with your updated medication list.  Colon due 2029 Will check PSA again Exercises regularly Had flu vaccine Recommended COVID update--but he is reluctant

## 2023-08-06 NOTE — Assessment & Plan Note (Signed)
Does fine with famotidine nightly

## 2023-08-06 NOTE — Assessment & Plan Note (Signed)
Low level stenosis On ASA, statin No neuro symptoms

## 2023-08-06 NOTE — Assessment & Plan Note (Signed)
No symptoms on ASA, atorvastatin 40, irbesartan 300 Hasn't needed nitro

## 2023-08-06 NOTE — Assessment & Plan Note (Signed)
He is ready to start Rx---will Rx tamsulosin

## 2023-08-06 NOTE — Assessment & Plan Note (Signed)
BP Readings from Last 3 Encounters:  08/06/23 130/84  01/02/23 118/68  12/28/22 102/60   Controlled on the irbesartan

## 2023-08-06 NOTE — Progress Notes (Signed)
Hearing Screening - Comments:: Passed whisper test  Vision Screening - Comments:: June 2024

## 2023-10-11 ENCOUNTER — Ambulatory Visit: Payer: Self-pay | Admitting: Internal Medicine

## 2023-10-11 ENCOUNTER — Ambulatory Visit: Payer: Medicare Other | Admitting: Primary Care

## 2023-10-11 ENCOUNTER — Encounter: Payer: Self-pay | Admitting: Primary Care

## 2023-10-11 VITALS — BP 118/66 | HR 85 | Temp 97.2°F | Ht 70.0 in | Wt 178.0 lb

## 2023-10-11 DIAGNOSIS — J0100 Acute maxillary sinusitis, unspecified: Secondary | ICD-10-CM | POA: Insufficient documentation

## 2023-10-11 MED ORDER — AMOXICILLIN-POT CLAVULANATE 875-125 MG PO TABS: 1.0000 | ORAL_TABLET | Freq: Two times a day (BID) | ORAL | 0 refills | Status: DC

## 2023-10-11 NOTE — Telephone Encounter (Signed)
Noted.  Will evaluate.

## 2023-10-11 NOTE — Assessment & Plan Note (Signed)
HPI suggestive.  Given duration of symptoms, coupled with purulent sinus discharge, will treat for presumed bacterial involvement.  Start Augmentin antibiotics. Take 1 tablet by mouth twice daily for 7 days.  Continue Neti Pot rinses.  Follow up PRN.

## 2023-10-11 NOTE — Progress Notes (Signed)
Subjective:    Patient ID: [name], male    DOB: 1956/06/08, 68 y.o.   MRN: [id number]  HPI  [name] is a very pleasant 68 y.o. male patient of Dr. Alphonsus Sias with a history of hypertension, GERD, CAD, allergic rhinitis who presents today to discuss sinus pressure.  Symptom onset two weeks ago with right maxillary sinus pressure and nasal congestion. He used a Environmental consultant and symptoms improved without resolve. Yesterday he developed left maxillary sinus pressure. He's continued to use a Neti Pot and has been seeing green nasal discharge continuously. He's unable to blow anything out of his nose.Symptoms are worse with leaning forward.   He's also been taking Coricidin as needed.   He denies fevers, chills, cough, body aches, post nasal drip.   Review of Systems  Constitutional:  Negative for chills and fever.  HENT:  Positive for congestion, sinus pressure and sinus pain. Negative for postnasal drip, rhinorrhea and sore throat.   Respiratory:  Negative for cough.          Past Medical History:  Diagnosis Date   Allergic rhinitis    Allergy    Asthma    Bradycardia    Relative bradycardia related to PACs and PVCs when measuring heart rate, benign, effects ability to measure blood pressure   CAD (coronary artery disease)    Cypher Stent RCA and circumflex 2003( long-term Plavix and aspirin) / nuclear 2008, no ischemia, couplets and triplets in recovery   Carotid artery disease (HCC)    Doppler, and November, 2011, normal carotid arteries, patent vertebrals   COPD (chronic obstructive pulmonary disease) (HCC)    hx smoker, no inhalers   Dyslipidemia    Ejection fraction 2015   EF 55-60%, echo, stress test normal   GERD (gastroesophageal reflux disease)    Hemorrhoids    hx   Hiatal hernia    History of colonic polyps    HTN (hypertension)    Hyperlipidemia    Mitral regurgitation    Mild, echo, 2008   Zenker's diverticulum    Bleed and surgical repair     Social History   Socioeconomic History   Marital status: Divorced    Spouse name: Not on file   Number of children: 2   Years of education: Not on file   Highest education level: Not on file  Occupational History   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  Tobacco Use   Smoking status: Former    Current packs/day: 0.00    Average packs/day: 3.0 packs/day for 35.0 years (105.0 ttl pk-yrs)    Types: Cigarettes    Start date: 06/16/1971    Quit date: 06/15/2006    Years since quitting: 17.3    Passive exposure: Past   Smokeless tobacco: Never  Vaping Use   Vaping status: Never Used  Substance and Sexual Activity   Alcohol use: Yes    Alcohol/week: 0.0 standard drinks of alcohol    Comment: occasional beer   Drug use: No   Sexual activity: Not on file  Other Topics Concern   Not on file  Social History Narrative   Has living will   Daughters should be health care POA   Would accept resuscitation but no prolonged ventilation   No tube feeds if cognitively unaware   Social Drivers of Health   Financial Resource Strain: Not on file  Food Insecurity: Not on file  Transportation Needs: Not on file  Physical  Activity: Not on file  Stress: Not on file  Social Connections: Not on file  Intimate Partner Violence: Not on file    Past Surgical History:  Procedure Laterality Date   COLONOSCOPY  2006, 2012, 2017   polyps   CORONARY ANGIOPLASTY WITH STENT PLACEMENT  02/2002   x2   LEFT WRIST  04-2009   RIGHT SHOULDER  04-2009   ZENKER'S DIVERTICULECTOMY ENDOSCOPIC  08-2005   repeat in 2012    Family History  Problem Relation Age of Onset   Heart disease Father 27       MVA   Hypertension Mother    Coronary artery disease Mother    Heart failure Mother    Stroke Mother    Heart disease Brother        CABG   Cancer Neg Hx        no prostate or colon cancer   Colon cancer Neg Hx    Rectal cancer Neg Hx    Stomach cancer Neg Hx    Esophageal cancer  Neg Hx     Allergies  Allergen Reactions   Bee Venom Anaphylaxis    REACTION: anaphylaxis    Current Outpatient Medications on File Prior to Visit  Medication Sig Dispense Refill   amLODipine (NORVASC) 5 MG tablet Take 1 tablet (5 mg total) by mouth daily. 90 tablet 3   aspirin 81 MG tablet Take 81 mg by mouth daily.     atorvastatin (LIPITOR) 40 MG tablet TAKE 1 TABLET BY MOUTH ONCE DAILY AT  6PM 90 tablet 3   cetirizine (ZYRTEC) 10 MG tablet Take 10 mg by mouth daily.     cholecalciferol (VITAMIN D3) 25 MCG (1000 UNIT) tablet Take 1,000 Units by mouth daily.     Coenzyme Q10 (CO Q 10) 100 MG CAPS Take 1 tablet by mouth daily.     CREATINE PO Take by mouth.     famotidine (PEPCID) 20 MG tablet Take 20 mg by mouth daily.     irbesartan (AVAPRO) 300 MG tablet Take 1 tablet (300 mg total) by mouth daily. 90 tablet 3   Multiple Vitamin (MULTI VITAMIN DAILY PO) Take 1 tablet by mouth daily.     Omega-3 Fatty Acids (ULTRA OMEGA-3 FISH OIL) 1400 MG CAPS Take 1 capsule by mouth daily.     tamsulosin (FLOMAX) 0.4 MG CAPS capsule Take 1 capsule (0.4 mg total) by mouth daily. 90 capsule 3   TURMERIC PO Take by mouth.     nitroGLYCERIN (NITROSTAT) 0.4 MG SL tablet Place 1 tablet (0.4 mg total) under the tongue every 5 (five) minutes as needed for chest pain. 90 tablet 3   No current facility-administered medications on file prior to visit.    BP 118/66   Pulse 85   Temp (!) 97.2 F (36.2 C) (Temporal)   Ht 5\' 10"  (1.778 m)   Wt 178 lb (80.7 kg)   SpO2 98%   BMI 25.54 kg/m  Objective:   Physical Exam Constitutional:      Appearance: He is not ill-appearing.  HENT:     Right Ear: Tympanic membrane and ear canal normal.     Left Ear: Tympanic membrane and ear canal normal.     Nose: No mucosal edema.     Right Sinus: No maxillary sinus tenderness or frontal sinus tenderness.     Left Sinus: No maxillary sinus tenderness or frontal sinus tenderness.     Mouth/Throat:     Mouth:  Mucous membranes are moist.  Eyes:     Conjunctiva/sclera: Conjunctivae normal.  Cardiovascular:     Rate and Rhythm: Normal rate and regular rhythm.  Pulmonary:     Effort: Pulmonary effort is normal.     Breath sounds: Normal breath sounds. No wheezing or rhonchi.  Musculoskeletal:     Cervical back: Neck supple.  Skin:    General: Skin is warm and dry.           Assessment & Plan:  Acute non-recurrent maxillary sinusitis Assessment & Plan: HPI suggestive.  Given duration of symptoms, coupled with purulent sinus discharge, will treat for presumed bacterial involvement.  Start Augmentin antibiotics. Take 1 tablet by mouth twice daily for 7 days.  Continue Neti Pot rinses.  Follow up PRN.  Orders: -     Amoxicillin-Pot Clavulanate; Take 1 tablet by mouth 2 (two) times daily. Take for 7 days for sinus infection.  Dispense: 14 tablet; Refill: 0        Doreene Nest, NP

## 2023-10-11 NOTE — Patient Instructions (Addendum)
Start Augmentin 1 tablet twice daily for 7 days.   Hope you feel better soon.

## 2023-10-11 NOTE — Telephone Encounter (Signed)
Copied from CRM [id number]. Topic: Clinical - Red Word Triage >> Oct 11, 2023  8:36 AM [name] wrote: Red Word that prompted transfer to Nurse Triage: Sinus infection in right nostril and now in the left nostril and producing green mucus  Chief Complaint: Nasal congestion Symptoms: Sinus pain, nasal discharge Frequency: 2 weeks Pertinent Negatives: Patient denies fever Disposition: [] ED /[] Urgent Care (no appt availability in office) / [x] Appointment(In office/virtual)/ []  [place] [facility]/ [] Home Care/ [] Refused Recommended Disposition /[] [place] [facility]/ []  Follow-up with PCP Additional Notes: Patient called in to report sinus congestion that has been persisting for about 2 weeks. Patient stated his nose is producing green mucous. Patient stated he feels pain under his left cheekbone. Patient has been doing nasal rinses, but denied complete relief. Patient denied fever and other symptoms. Advised patient to see a provider within 3 days. No availability with PCP. Scheduled same day appointment with an alternate provider in the office. Advised patient to call back if symptoms worsen. Patient complied.   Reason for Disposition  [1] Sinus congestion (pressure, fullness) AND [2] present > 10 days  Answer Assessment - Initial Assessment Questions 1. LOCATION: "Where does it hurt?"      Pressure on the left side under cheek bone 2. ONSET: "When did the sinus pain start?"  (e.g., hours, days)      2 weeks 3. SEVERITY: "How bad is the pain?"   (Scale 1-10; mild, moderate or severe)   - MILD (1-3): doesn't interfere with normal activities    - MODERATE (4-7): interferes with normal activities (e.g., work or school) or awakens from sleep   - SEVERE (8-10): excruciating pain and patient unable to do any normal activities        States pain is a 1 at this time 5. NASAL CONGESTION: "Is the nose blocked?" If Yes, ask: "Can you open it or must you breathe through your mouth?"     States just  the left nostril is stuffed up 6. NASAL DISCHARGE: "Do you have discharge from your nose?" If so ask, "What color?"     Green mucous 7. FEVER: "Do you have a fever?" If Yes, ask: "What is it, how was it measured, and when did it start?"      Denies fever 8. OTHER SYMPTOMS: "Do you have any other symptoms?" (e.g., sore throat, cough, earache, difficulty breathing)     Denies other symptoms  Protocols used: Sinus Pain or Congestion-A-AH

## 2023-10-23 ENCOUNTER — Encounter: Payer: Self-pay | Admitting: Internal Medicine

## 2023-10-24 ENCOUNTER — Other Ambulatory Visit: Payer: Self-pay | Admitting: Internal Medicine

## 2023-10-30 MED ORDER — FINASTERIDE 5 MG PO TABS: 5.0000 mg | ORAL_TABLET | Freq: Every day | ORAL | 3 refills | Status: AC

## 2023-12-10 ENCOUNTER — Other Ambulatory Visit: Payer: Self-pay | Admitting: Physician Assistant

## 2023-12-10 DIAGNOSIS — E782 Mixed hyperlipidemia: Secondary | ICD-10-CM

## 2023-12-23 ENCOUNTER — Encounter: Payer: Self-pay | Admitting: Cardiology

## 2023-12-23 ENCOUNTER — Ambulatory Visit: Payer: Medicare Other | Attending: Cardiology | Admitting: Cardiology

## 2023-12-23 VITALS — BP 134/70 | HR 56 | Ht 70.0 in | Wt 174.8 lb

## 2023-12-23 DIAGNOSIS — R011 Cardiac murmur, unspecified: Secondary | ICD-10-CM

## 2023-12-23 DIAGNOSIS — I2510 Atherosclerotic heart disease of native coronary artery without angina pectoris: Secondary | ICD-10-CM | POA: Diagnosis not present

## 2023-12-23 DIAGNOSIS — I340 Nonrheumatic mitral (valve) insufficiency: Secondary | ICD-10-CM

## 2023-12-23 DIAGNOSIS — I10 Essential (primary) hypertension: Secondary | ICD-10-CM | POA: Diagnosis not present

## 2023-12-23 DIAGNOSIS — E782 Mixed hyperlipidemia: Secondary | ICD-10-CM

## 2023-12-23 NOTE — Progress Notes (Signed)
 [name]

## 2023-12-23 NOTE — Progress Notes (Signed)
 Cardiology Office Note:  .   Date:  12/23/2023  ID:  [name], DOB 1956/01/22, MRN [id number] PCP: Roy Llanos, MD  [facility] Providers Cardiologist:  Dorothye Gathers, MD    History of Present Illness: .   [name] is a 68 y.o. male Discussed the use of AI scribe software for clinical note transcription with the patient, who gave verbal consent to proceed.  History of Present Illness [name] is a 68 year old male with coronary artery disease who presents for follow-up.  He has a history of coronary artery disease, having experienced a non-ST elevation myocardial infarction (non-STEMI) in July 2003. He was treated with a stent to the right coronary artery (RCA) and a staged intervention with a Cypher drug-eluting stent (DES) to the left circumflex artery. No recent cardiac symptoms and has been doing well on aspirin monotherapy.  He has a history of right bundle branch block and mild mitral valve regurgitation, with an ejection fraction of 60-65% noted on a dobutamine  stress echocardiogram in 2015. No recent palpitations or irregular heartbeats, although he has experienced premature ventricular contractions (PVCs) in the past. He occasionally experiences irregular beats, which he attributes to typical PVCs.  His current medications include aspirin 81 mg daily, amlodipine  5 mg daily, irbesartan  300 mg daily, and atorvastatin  40 mg daily for hyperlipidemia. His last recorded LDL was 72 mg/dL, and his creatinine level was 0.97 mg/dL, indicating normal kidney function.  Socially, he enjoys mountain biking, although he has not ridden as much this year. He is considering hiring a trainer to focus on weight training, including deadlifts and bench presses, and is cautious about avoiding injury. He has experienced knee swelling in the past, which he attributes to overexertion during exercise.      ROS: No syncope no CP.   Studies Reviewed: Aaron Aas   EKG  Interpretation Date/Time:  Monday December 23 2023 09:08:49 EDT Ventricular Rate:  56 PR Interval:  194 QRS Duration:  122 QT Interval:  394 QTC Calculation: 380 R Axis:   78  Text Interpretation: Sinus bradycardia Poor R wave progression When compared with ECG of 24-May-2009 17:14, Premature ventricular complexes are no longer Present Confirmed by Dorothye Gathers ([id number]) on 12/23/2023 9:15:46 AM    Results LABS LDL: 72 (07/2023) Creatinine: 0.97 (07/2023) ALT: 21 (07/2023)  DIAGNOSTIC EKG: Normal (12/23/2023) Dobutamine  stress echo: Normal (2015) Echocardiogram: Mild mitral valve regurgitation, EF 60-65% (2015) Risk Assessment/Calculations:            Physical Exam:   VS:  BP 134/70   Pulse (!) 56   Ht 5\' 10"  (1.778 m)   Wt 174 lb 12.8 oz (79.3 kg)   SpO2 99%   BMI 25.08 kg/m    Wt Readings from Last 3 Encounters:  12/23/23 174 lb 12.8 oz (79.3 kg)  10/11/23 178 lb (80.7 kg)  08/06/23 170 lb (77.1 kg)    GEN: Well nourished, well developed in no acute distress NECK: No JVD; No carotid bruits CARDIAC: RRR, 2/6 HSM, no rubs, no gallops RESPIRATORY:  Clear to auscultation without rales, wheezing or rhonchi  ABDOMEN: Soft, non-tender, non-distended EXTREMITIES:  No edema; No deformity   ASSESSMENT AND PLAN: .    Assessment and Plan Assessment & Plan Coronary artery disease Non-STEMI in July 2023, treated with stents to RCA and left circumflex in 2003. Currently asymptomatic and engages in physical activities like mountain biking. Blood pressure and cholesterol levels are well-controlled with current medication  regimen. - Continue aspirin 81 mg daily - Continue atorvastatin  40 mg daily - Continue irbesartan  300 mg daily - Continue amlodipine  5 mg daily  Mitral valve regurgitation Mild mitral valve regurgitation on echocardiogram in 2015 with EF 60-65%. Soft murmur auscultated. Discussed potential for mitral regurgitation to contribute to atrial fibrillation due to left  atrial dilation. No current symptoms of atrial fibrillation reported. - Order echocardiogram to assess status of mitral valve  Right bundle branch block Chronic right bundle branch block. EKG remains stable with no new findings.  Mild carotid artery disease Mild carotid artery disease managed with aspirin and atorvastatin . - Continue aspirin 81 mg daily - Continue atorvastatin  40 mg daily  Hyperlipidemia Hyperlipidemia well-controlled with atorvastatin . Last LDL was 72 mg/dL. Liver function tests are normal. - Continue atorvastatin  40 mg daily         Dispo: 2 yrs  Signed, Dorothye Gathers, MD

## 2023-12-23 NOTE — Patient Instructions (Signed)
 Medication Instructions:  Your physician recommends that you continue on your current medications as directed. Please refer to the Current Medication list given to you today.  *If you need a refill on your cardiac medications before your next appointment, please call your pharmacy*  Lab Work: None ordered.  If you have labs (blood work) drawn today and your tests are completely normal, you will receive your results only by: MyChart Message (if you have MyChart) OR A paper copy in the mail If you have any lab test that is abnormal or we need to change your treatment, we will call you to review the results.  Testing/Procedures: Your physician has requested that you have an echocardiogram. Echocardiography is a painless test that uses sound waves to create images of your heart. It provides your doctor with information about the size and shape of your heart and how well your heart's chambers and valves are working. This procedure takes approximately one hour. There are no restrictions for this procedure. Please do NOT wear cologne, perfume, aftershave, or lotions (deodorant is allowed). Please arrive 15 minutes prior to your appointment time.  Please note: We ask at that you not bring children with you during ultrasound (echo/ vascular) testing. Due to room size and safety concerns, children are not allowed in the ultrasound rooms during exams. Our front office staff cannot provide observation of children in our lobby area while testing is being conducted. An adult accompanying a patient to their appointment will only be allowed in the ultrasound room at the discretion of the ultrasound technician under special circumstances. We apologize for any inconvenience.   Follow-Up: At [facility], you and your health needs are our priority.  As part of our continuing mission to provide you with exceptional heart care, our providers are all part of one team.  This team includes your primary  Cardiologist (physician) and Advanced Practice Providers or APPs (Physician Assistants and Nurse Practitioners) who all work together to provide you with the care you need, when you need it.  Your next appointment:   2 year follow up with Dr Renna Cary

## 2024-01-03 ENCOUNTER — Other Ambulatory Visit: Payer: Self-pay | Admitting: Physician Assistant

## 2024-01-11 ENCOUNTER — Other Ambulatory Visit: Payer: Self-pay | Admitting: Physician Assistant

## 2024-01-11 DIAGNOSIS — E782 Mixed hyperlipidemia: Secondary | ICD-10-CM

## 2024-01-17 ENCOUNTER — Other Ambulatory Visit: Payer: Self-pay

## 2024-01-17 MED ORDER — ATORVASTATIN CALCIUM 40 MG PO TABS: ORAL_TABLET | ORAL | 3 refills | Status: AC

## 2024-01-22 ENCOUNTER — Ambulatory Visit: Payer: Self-pay | Admitting: Cardiology

## 2024-01-22 ENCOUNTER — Ambulatory Visit (HOSPITAL_COMMUNITY)
Admission: RE | Admit: 2024-01-22 | Discharge: 2024-01-22 | Disposition: A | Discharge status: Home / Self Care | Source: Ambulatory Visit | Attending: Cardiovascular Disease | Admitting: Cardiovascular Disease

## 2024-01-22 DIAGNOSIS — R011 Cardiac murmur, unspecified: Secondary | ICD-10-CM | POA: Insufficient documentation

## 2024-01-22 DIAGNOSIS — I340 Nonrheumatic mitral (valve) insufficiency: Secondary | ICD-10-CM | POA: Diagnosis present

## 2024-01-22 LAB — ECHOCARDIOGRAM COMPLETE
Area-P 1/2: 3.79 cm2
P 1/2 time: 1904 ms
S' Lateral: 3.2 cm

## 2024-06-12 NOTE — Telephone Encounter (Signed)
 Copied from CRM #[id number]. Topic: Appointments - Scheduling Inquiry for Clinic >> Jun 12, 2024  2:12 PM [place] wrote: Reason for CRM: Patient calling to schedule a flu shot. I am unable to schedule. Error message coming up> Please call patient to schedule

## 2024-06-16 ENCOUNTER — Ambulatory Visit

## 2024-06-16 DIAGNOSIS — Z23 Encounter for immunization: Secondary | ICD-10-CM | POA: Diagnosis not present

## 2024-06-16 NOTE — Progress Notes (Signed)
 Per orders of Dr. Reuben Burkes , injection of High Dose Flu Vaccine given by Nellie Hummer in left deltoid. Patient tolerated injection well.

## 2024-08-06 ENCOUNTER — Ambulatory Visit

## 2024-08-06 VITALS — BP 116/68 | HR 58 | Temp 97.8°F | Ht 69.75 in | Wt 173.0 lb

## 2024-08-06 DIAGNOSIS — N401 Enlarged prostate with lower urinary tract symptoms: Secondary | ICD-10-CM | POA: Diagnosis not present

## 2024-08-06 DIAGNOSIS — N138 Other obstructive and reflux uropathy: Secondary | ICD-10-CM

## 2024-08-06 DIAGNOSIS — I10 Essential (primary) hypertension: Secondary | ICD-10-CM | POA: Diagnosis not present

## 2024-08-06 DIAGNOSIS — Z131 Encounter for screening for diabetes mellitus: Secondary | ICD-10-CM

## 2024-08-06 DIAGNOSIS — K219 Gastro-esophageal reflux disease without esophagitis: Secondary | ICD-10-CM | POA: Diagnosis not present

## 2024-08-06 DIAGNOSIS — Z0000 Encounter for general adult medical examination without abnormal findings: Secondary | ICD-10-CM

## 2024-08-06 DIAGNOSIS — E559 Vitamin D deficiency, unspecified: Secondary | ICD-10-CM | POA: Diagnosis not present

## 2024-08-06 DIAGNOSIS — I340 Nonrheumatic mitral (valve) insufficiency: Secondary | ICD-10-CM | POA: Diagnosis not present

## 2024-08-06 DIAGNOSIS — Z125 Encounter for screening for malignant neoplasm of prostate: Secondary | ICD-10-CM | POA: Diagnosis not present

## 2024-08-06 DIAGNOSIS — I2510 Atherosclerotic heart disease of native coronary artery without angina pectoris: Secondary | ICD-10-CM | POA: Diagnosis not present

## 2024-08-06 DIAGNOSIS — E785 Hyperlipidemia, unspecified: Secondary | ICD-10-CM | POA: Diagnosis not present

## 2024-08-06 LAB — LIPID PANEL
Cholesterol: 139 mg/dL (ref 0–200)
HDL: 58.8 mg/dL (ref >39.00)
LDL Cholesterol: 55 mg/dL (ref 0–99)
NonHDL: 80.17
Total CHOL/HDL Ratio: 2
Triglycerides: 124 mg/dL (ref 0.0–149.0)
VLDL: 24.8 mg/dL (ref 0.0–40.0)

## 2024-08-06 LAB — HEMOGLOBIN A1C: Hgb A1c MFr Bld: 5.9 % (ref 4.6–6.5)

## 2024-08-06 LAB — PSA: PSA: 2.11 ng/mL (ref 0.10–4.00)

## 2024-08-06 LAB — VITAMIN D 25 HYDROXY (VIT D DEFICIENCY, FRACTURES): VITD: 45.64 ng/mL (ref 30.00–100.00)

## 2024-08-06 NOTE — Patient Instructions (Signed)
 Thank you for visiting [facility] today! Here's what we talked about: - START taking Famotidine only as needed - Please scan us  a copy of your living will

## 2024-08-06 NOTE — Progress Notes (Signed)
 Assessment & Plan:   This visit was conducted in person. The patient gave informed consent to the use of [employer] AI technology to record the contents of the encounter as documented below.  Assessment & Plan Adult Wellness Visit Routine wellness visit with health maintenance discussion. Reviewed living will and healthcare power of attorney. - Scheduled follow-up in one year. - Requested electronic record of living will via MyChart.  I have personally reviewed and have noted: 1. The patient's medical and social history 2. Their use of alcohol, tobacco or illicit drugs 3. Their current medications and supplements 4. The patient's functional ability including ADL's, fall risks, home safety risks and hearing or visual impairment. 5. Diet and physical activities 6. Evidence for depression or mood disorder   Colonoscopy: Due in 2029, hx non cancerous polyps.  LDCT: N/A AAA Screening:N/A Labs: PSA, HIV, Hep C, Lipid and A1c UTD Immunizations: Will need prevnar next yr, Declines RSV, covid Diet and Exercise: No concerns, gym 4 times weekly Sleep and mood: No cconcerns Dental and vision:UTD ADLs and IADLs: Capable Cognitive: Intact to orientation, naming, recall and repetition Fall risk and home safety: No concerns Health counselling given: Exercise for reduced fall risk   Advanced Directives Patient does have advanced directives including living will. Kramer does not have a copy in the electronic medical record.  Has living will Daughter [name] should be health care POA Would accept resuscitation but no prolonged ventilation, would like trial bipap No tube feeds if cognitively unaware  Primary hypertension Blood pressure well-controlled on current medication. - Continue amlodipine  5 mg daily and irbesartan  300 mg daily.  Hyperlipidemia Managed with atorvastatin . - Continue atorvastatin  40 mg daily. - Ordered fasting lipid panel.  Gastroesophageal reflux disease GERD managed  with famotidine. Discussed trial of stopping to assess symptom recurrence.  - Trial stopping famotidine, use as needed. - Return if symptoms recur daily.  Benign prostatic hyperplasia with lower urinary tract symptoms BPH managed with finasteride . Discussed PSA monitoring.  - Ordered PSA test. - Monitor for worsening symptoms.  Mitral valve regurgitation Most recent echo showed normal EF with trivial mitral and aortic valve regurg, not clinically significant.  Continue to follow with cardiology.  Coronary artery disease with prior stent placement Well-managed condition, no recent nitroglycerin  use. NSTEMI in July 2023 treated with RCA and left circumflex stents. -Continue aspirin 81 mg daily, atorvastatin  40 mg daily, Avodart and 300 mg daily, amlodipine  5 mg daily   Vitamin D  deficiency Plan to check levels for monitoring - Ordered vitamin D  level test. - Continue Vit D 100 units daily  Prediabetes A1c previously in prediabetic range, last checked four years ago at 6.1. - Ordered A1c test.    Follow-up: Return in about 1 year (around 08/06/2025) for CPE, fasting labs 1 wk prior.        Subjective:   Patient ID: [name], male    DOB: 03-28-1956  Age: 68 y.o. MRN: [id number]  Patient Care Team: Roy Reuben POUR, MD as PCP - General (Family Medicine) Roy Oneil BROCKS, MD as PCP - Cardiology (Cardiology)   CC:  Chief Complaint  Patient presents with   Transfer of Care    Previous pt of Dr [name] is expecting to get his annual exam, but understands it may not get done.  Kramer is fasting.    [name] is a 68 y.o. male here for annual physical and f/u on chronic conditions, see assessment and plan for further details  Discussed the use of AI scribe software for clinical note transcription with the patient, who gave verbal consent to proceed.  History of Present Illness [name] is a 68 year old male who presents for an annual physical exam.  Kramer  has a history of high cholesterol, mitral valve regurgitation, high blood pressure, and coronary artery disease with stent placement. Kramer is currently taking aspirin, Lipitor 40 mg, amlodipine  5 mg, and irbesartan  300 mg daily. Kramer does not use nitroglycerin  and takes turmeric, omega-3 fish oil, a multivitamin, and CoQ10. Kramer is also on Zyrtec 10 mg daily for allergies.  Kramer has a history of acid reflux and has been taking over-the-counter famotidine daily since 2020 to address chest tightness attributed to acid reflux.  Kramer has a history of prostate enlargement and is on finasteride . Kramer is concerned about finasteride  potentially masking prostate cancer symptoms.  Kramer has a history of colon polyps and adenomas, with the last colonoscopy in 2022 showing a polyp.  Kramer is aware of being prediabetic, with past A1c levels around 6.0-6.1. His last A1c test was four years ago, and Kramer is due for a repeat test.  Kramer quit smoking on June 15, 2006, and used Wellbutrin to assist with cessation. Kramer exercises regularly, going to the gym four times a week, and reports no issues with sleep or mood.  Kramer is up to date on his flu, pneumonia, tetanus, and shingles vaccines. Kramer has not received the RSV vaccine.  Kramer has a living will and has designated his daughter [name] as his healthcare power of attorney.   Depression Screening;    08/06/2024    9:37 AM 08/06/2023   10:00 AM 08/06/2023    9:22 AM 08/03/2022   11:02 AM 08/02/2021    9:47 AM 07/15/2020   10:31 AM 07/10/2019    9:23 AM  PHQ 2/9 Scores  PHQ - 2 Score 1 0 0 0 0 0 0     Anxiety Screening:     No data to display           ROS: Negative unless specifically indicated above in HPI.       Objective:     BP 116/68 (BP Location: Right Arm, Patient Position: Sitting, Cuff Size: Large)   Pulse (!) 58   Temp 97.8 F (36.6 C) (Oral)   Ht 5' 9.75 (1.772 m)   Wt 173 lb (78.5 kg)   SpO2 96%   BMI 25.00 kg/m    Physical Exam GENERAL:  Alert, cooperative, well developed, no acute distress. HEAD: Normocephalic atraumatic. EYES: Extraocular movements intact bilaterally, pupils round, equal and reactive to light bilaterally, conjunctivae normal bilaterally. EARS: Tympanic membrane, ear canal and external ear normal bilaterally. NOSE: No congestion or rhinorrhea, mucous membranes are moist. THROAT: No oropharyngeal exudate or posterior oropharyngeal erythema, throat normal. CARDIOVASCULAR: Normal heart rate and rhythm, S1 and S2 normal. Murmur present. CHEST: Clear to auscultation bilaterally, no wheezes, rhonchi, or crackles. ABDOMEN: Soft, non-tender, non-distended, without organomegaly, normal bowel sounds. EXTREMITIES: No cyanosis or edema. NEUROLOGICAL: Oriented to person, place and time, no gait abnormalities, moves all extremities without gross motor or sensory deficit. NECK: Neck supple, thyroid  non-tender.        Zhanae Proffit K Aislyn Hayse, MD  08/06/2024    Contains text generated by [employer].

## 2024-08-06 NOTE — Progress Notes (Signed)
 Up to date on vaccines. Last pneumonia vaccine series was completed 07-15-20. Sees dentist regularly.  Eye exam within the last year. Has not had his hearing checked this year.

## 2024-08-09 ENCOUNTER — Ambulatory Visit: Payer: Self-pay

## 2024-08-14 ENCOUNTER — Other Ambulatory Visit

## 2024-08-14 DIAGNOSIS — Z9189 Other specified personal risk factors, not elsewhere classified: Secondary | ICD-10-CM | POA: Diagnosis not present

## 2024-08-14 DIAGNOSIS — I10 Essential (primary) hypertension: Secondary | ICD-10-CM | POA: Diagnosis not present

## 2024-08-14 LAB — COMPREHENSIVE METABOLIC PANEL WITH GFR
ALT: 21 U/L (ref 3–53)
AST: 20 U/L (ref 5–37)
Albumin: 4.3 g/dL (ref 3.5–5.2)
Alkaline Phosphatase: 61 U/L (ref 39–117)
BUN: 21 mg/dL (ref 6–23)
CO2: 28 meq/L (ref 19–32)
Calcium: 9.3 mg/dL (ref 8.4–10.5)
Chloride: 103 meq/L (ref 96–112)
Creatinine, Ser: 1.01 mg/dL (ref 0.40–1.50)
GFR: 75.57 mL/min
Glucose, Bld: 109 mg/dL — ABNORMAL HIGH (ref 70–99)
Potassium: 4 meq/L (ref 3.5–5.1)
Sodium: 137 meq/L (ref 135–145)
Total Bilirubin: 0.8 mg/dL (ref 0.2–1.2)
Total Protein: 6.7 g/dL (ref 6.0–8.3)

## 2024-08-15 ENCOUNTER — Ambulatory Visit: Payer: Self-pay

## 2024-09-30 ENCOUNTER — Other Ambulatory Visit: Payer: Self-pay | Admitting: Physician Assistant

## 2024-09-30 DIAGNOSIS — E782 Mixed hyperlipidemia: Secondary | ICD-10-CM

## 2024-10-30 ENCOUNTER — Ambulatory Visit

## 2025-08-04 ENCOUNTER — Other Ambulatory Visit

## 2025-08-10 ENCOUNTER — Encounter
